# Patient Record
Sex: Male | Born: 1965 | Race: Black or African American | Hispanic: No | Marital: Married | Smoking: Former smoker
Health system: Southern US, Community
[De-identification: ages and names within clinical notes are randomized; demographics above are authoritative.]

## PROBLEM LIST (undated history)

## (undated) ENCOUNTER — Ambulatory Visit (HOSPITAL_COMMUNITY): Discharge status: Absent without leave | Payer: Self-pay

## (undated) DIAGNOSIS — J449 Chronic obstructive pulmonary disease, unspecified: Secondary | ICD-10-CM

## (undated) DIAGNOSIS — W3400XA Accidental discharge from unspecified firearms or gun, initial encounter: Secondary | ICD-10-CM

## (undated) DIAGNOSIS — I10 Essential (primary) hypertension: Secondary | ICD-10-CM

## (undated) DIAGNOSIS — J40 Bronchitis, not specified as acute or chronic: Secondary | ICD-10-CM

## (undated) DIAGNOSIS — J45909 Unspecified asthma, uncomplicated: Secondary | ICD-10-CM

## (undated) DIAGNOSIS — T7840XA Allergy, unspecified, initial encounter: Secondary | ICD-10-CM

## (undated) DIAGNOSIS — K219 Gastro-esophageal reflux disease without esophagitis: Secondary | ICD-10-CM

## (undated) DIAGNOSIS — Z5189 Encounter for other specified aftercare: Secondary | ICD-10-CM

## (undated) DIAGNOSIS — Y249XXA Unspecified firearm discharge, undetermined intent, initial encounter: Secondary | ICD-10-CM

## (undated) HISTORY — DX: Encounter for other specified aftercare: Z51.89

## (undated) HISTORY — PX: STOMACH SURGERY: SHX791

## (undated) HISTORY — DX: Allergy, unspecified, initial encounter: T78.40XA

## (undated) HISTORY — PX: OTHER SURGICAL HISTORY: SHX169

## (undated) HISTORY — PX: HIP SURGERY: SHX245

## (undated) HISTORY — DX: Gastro-esophageal reflux disease without esophagitis: K21.9

## (undated) HISTORY — PX: ABDOMINAL SURGERY: SHX537

---

## 2000-04-26 ENCOUNTER — Emergency Department (HOSPITAL_COMMUNITY): Admission: EM | Admit: 2000-04-26 | Discharge: 2000-04-26 | Payer: Self-pay | Admitting: Emergency Medicine

## 2000-07-29 ENCOUNTER — Emergency Department (HOSPITAL_COMMUNITY): Admission: EM | Admit: 2000-07-29 | Discharge: 2000-07-29 | Payer: Self-pay | Admitting: Emergency Medicine

## 2000-07-29 ENCOUNTER — Encounter: Payer: Self-pay | Admitting: Emergency Medicine

## 2002-01-31 ENCOUNTER — Emergency Department (HOSPITAL_COMMUNITY): Admission: EM | Admit: 2002-01-31 | Discharge: 2002-01-31 | Payer: Self-pay | Admitting: Emergency Medicine

## 2002-01-31 ENCOUNTER — Encounter: Payer: Self-pay | Admitting: Emergency Medicine

## 2002-02-05 ENCOUNTER — Emergency Department (HOSPITAL_COMMUNITY): Admission: EM | Admit: 2002-02-05 | Discharge: 2002-02-05 | Payer: Self-pay | Admitting: Physical Therapy

## 2002-05-28 ENCOUNTER — Emergency Department (HOSPITAL_COMMUNITY): Admission: EM | Admit: 2002-05-28 | Discharge: 2002-05-28 | Payer: Self-pay | Admitting: Emergency Medicine

## 2002-09-28 ENCOUNTER — Emergency Department (HOSPITAL_COMMUNITY): Admission: EM | Admit: 2002-09-28 | Discharge: 2002-09-28 | Payer: Self-pay | Admitting: Emergency Medicine

## 2002-09-28 ENCOUNTER — Encounter: Payer: Self-pay | Admitting: Emergency Medicine

## 2005-11-05 ENCOUNTER — Emergency Department (HOSPITAL_COMMUNITY): Admission: EM | Admit: 2005-11-05 | Discharge: 2005-11-05 | Payer: Self-pay | Admitting: Emergency Medicine

## 2007-10-15 ENCOUNTER — Emergency Department (HOSPITAL_COMMUNITY): Admission: EM | Admit: 2007-10-15 | Discharge: 2007-10-16 | Payer: Self-pay | Admitting: Emergency Medicine

## 2007-10-15 ENCOUNTER — Emergency Department (HOSPITAL_COMMUNITY): Admission: EM | Admit: 2007-10-15 | Discharge: 2007-10-15 | Payer: Self-pay | Admitting: Emergency Medicine

## 2008-07-31 ENCOUNTER — Emergency Department (HOSPITAL_COMMUNITY): Admission: EM | Admit: 2008-07-31 | Discharge: 2008-07-31 | Payer: Self-pay | Admitting: Emergency Medicine

## 2009-02-17 ENCOUNTER — Emergency Department (HOSPITAL_COMMUNITY): Admission: EM | Admit: 2009-02-17 | Discharge: 2009-02-17 | Payer: Self-pay | Admitting: Emergency Medicine

## 2009-09-24 ENCOUNTER — Emergency Department (HOSPITAL_COMMUNITY): Admission: EM | Admit: 2009-09-24 | Discharge: 2009-09-24 | Payer: Self-pay | Admitting: Family Medicine

## 2010-03-30 ENCOUNTER — Emergency Department (HOSPITAL_COMMUNITY): Payer: Self-pay

## 2010-03-30 ENCOUNTER — Emergency Department (HOSPITAL_COMMUNITY)
Admission: EM | Admit: 2010-03-30 | Discharge: 2010-03-30 | Disposition: A | Discharge status: Home / Self Care | Payer: Self-pay | Attending: Emergency Medicine | Admitting: Emergency Medicine

## 2010-03-30 DIAGNOSIS — R059 Cough, unspecified: Secondary | ICD-10-CM | POA: Insufficient documentation

## 2010-03-30 DIAGNOSIS — R070 Pain in throat: Secondary | ICD-10-CM | POA: Insufficient documentation

## 2010-03-30 DIAGNOSIS — R05 Cough: Secondary | ICD-10-CM | POA: Insufficient documentation

## 2010-03-30 DIAGNOSIS — R0602 Shortness of breath: Secondary | ICD-10-CM | POA: Insufficient documentation

## 2010-03-30 DIAGNOSIS — D1739 Benign lipomatous neoplasm of skin and subcutaneous tissue of other sites: Secondary | ICD-10-CM | POA: Insufficient documentation

## 2010-03-30 DIAGNOSIS — G8929 Other chronic pain: Secondary | ICD-10-CM | POA: Insufficient documentation

## 2010-03-30 DIAGNOSIS — J40 Bronchitis, not specified as acute or chronic: Secondary | ICD-10-CM | POA: Insufficient documentation

## 2010-03-30 DIAGNOSIS — R599 Enlarged lymph nodes, unspecified: Secondary | ICD-10-CM | POA: Insufficient documentation

## 2010-03-30 DIAGNOSIS — R062 Wheezing: Secondary | ICD-10-CM | POA: Insufficient documentation

## 2010-03-30 DIAGNOSIS — M549 Dorsalgia, unspecified: Secondary | ICD-10-CM | POA: Insufficient documentation

## 2010-10-13 LAB — POCT CARDIAC MARKERS: CKMB, poc: 1.7

## 2011-05-04 ENCOUNTER — Emergency Department (HOSPITAL_COMMUNITY)
Admission: EM | Admit: 2011-05-04 | Discharge: 2011-05-05 | Disposition: A | Discharge status: Home / Self Care | Payer: Self-pay | Attending: Emergency Medicine | Admitting: Emergency Medicine

## 2011-05-04 DIAGNOSIS — K0889 Other specified disorders of teeth and supporting structures: Secondary | ICD-10-CM

## 2011-05-04 DIAGNOSIS — R05 Cough: Secondary | ICD-10-CM | POA: Insufficient documentation

## 2011-05-04 DIAGNOSIS — J40 Bronchitis, not specified as acute or chronic: Secondary | ICD-10-CM | POA: Insufficient documentation

## 2011-05-04 DIAGNOSIS — R059 Cough, unspecified: Secondary | ICD-10-CM | POA: Insufficient documentation

## 2011-05-04 DIAGNOSIS — R0602 Shortness of breath: Secondary | ICD-10-CM | POA: Insufficient documentation

## 2011-05-04 DIAGNOSIS — R062 Wheezing: Secondary | ICD-10-CM | POA: Insufficient documentation

## 2011-05-04 DIAGNOSIS — K089 Disorder of teeth and supporting structures, unspecified: Secondary | ICD-10-CM | POA: Insufficient documentation

## 2011-05-05 ENCOUNTER — Emergency Department (HOSPITAL_COMMUNITY): Payer: Self-pay

## 2011-05-05 ENCOUNTER — Encounter (HOSPITAL_COMMUNITY): Payer: Self-pay | Admitting: *Deleted

## 2011-05-05 LAB — POCT I-STAT, CHEM 8
BUN: 16 mg/dL (ref 6–23)
Calcium, Ion: 1.17 mmol/L (ref 1.12–1.32)
Chloride: 108 mEq/L (ref 96–112)
HCT: 42 % (ref 39.0–52.0)
Sodium: 140 mEq/L (ref 135–145)
TCO2: 24 mmol/L (ref 0–100)

## 2011-05-05 LAB — CBC
MCH: 30.7 pg (ref 26.0–34.0)
MCHC: 34.8 g/dL (ref 30.0–36.0)
MCV: 88.3 fL (ref 78.0–100.0)
RDW: 12.7 % (ref 11.5–15.5)

## 2011-05-05 MED ORDER — ALBUTEROL SULFATE (5 MG/ML) 0.5% IN NEBU
5.0000 mg | INHALATION_SOLUTION | Freq: Once | RESPIRATORY_TRACT | Status: AC
  Administered 2011-05-05: 5 mg via RESPIRATORY_TRACT
  Filled 2011-05-05: qty 1

## 2011-05-05 MED ORDER — AZITHROMYCIN 250 MG PO TABS: 250.0000 mg | ORAL_TABLET | Freq: Every day | ORAL | Status: AC

## 2011-05-05 MED ORDER — BENZONATATE 100 MG PO CAPS: 200.0000 mg | ORAL_CAPSULE | Freq: Two times a day (BID) | ORAL | Status: AC | PRN

## 2011-05-05 MED ORDER — AZITHROMYCIN 250 MG PO TABS: 250.0000 mg | ORAL_TABLET | Freq: Every day | ORAL | Status: DC

## 2011-05-05 MED ORDER — ALBUTEROL SULFATE HFA 108 (90 BASE) MCG/ACT IN AERS
2.0000 | INHALATION_SPRAY | Freq: Once | RESPIRATORY_TRACT | Status: AC
  Administered 2011-05-05: 2 via RESPIRATORY_TRACT
  Filled 2011-05-05: qty 6.7

## 2011-05-05 MED ORDER — KETOROLAC TROMETHAMINE 60 MG/2ML IM SOLN
60.0000 mg | Freq: Once | INTRAMUSCULAR | Status: AC
  Administered 2011-05-05: 60 mg via INTRAMUSCULAR
  Filled 2011-05-05: qty 2

## 2011-05-05 MED ORDER — BENZONATATE 100 MG PO CAPS: 200.0000 mg | ORAL_CAPSULE | Freq: Two times a day (BID) | ORAL | Status: DC | PRN
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
## 2011-05-05 NOTE — Discharge Instructions (Signed)
Please call the phone numbers listed below if you do not have a dentist or doctor. Take the Zithromax every day to help with your dental pain and a cough, use the albuterol inhaler 2 puffs every 4 hours for difficulty breathing or wheezing, take the Tessalon for cough.  Your chest x-ray was normal, your blood work was normal.

## 2011-05-05 NOTE — ED Notes (Signed)
Patient completed breathing treatment.  Rhonchi decreased and no exp wheezes

## 2011-05-05 NOTE — ED Notes (Signed)
Pt is here for evaluation of sob that began today at work (but he reports some sob "for a while").  Pt states that he has had a cough which is productive for brown sputum.  Pt states that he has increasing sob with activity.  Pt appears sob in triage, speaking in full sentences.  Pt also reports dental pain and dental abscess (recently pulled his own tooth and it broke).  No fever or chills with this.

## 2011-09-15 ENCOUNTER — Emergency Department (HOSPITAL_COMMUNITY): Payer: Self-pay

## 2011-09-15 ENCOUNTER — Encounter (HOSPITAL_COMMUNITY): Payer: Self-pay | Admitting: Emergency Medicine

## 2011-09-15 ENCOUNTER — Emergency Department (HOSPITAL_COMMUNITY)
Admission: EM | Admit: 2011-09-15 | Discharge: 2011-09-15 | Disposition: A | Discharge status: Home / Self Care | Payer: Self-pay | Attending: Emergency Medicine | Admitting: Emergency Medicine

## 2011-09-15 DIAGNOSIS — R0602 Shortness of breath: Secondary | ICD-10-CM | POA: Insufficient documentation

## 2011-09-15 DIAGNOSIS — J209 Acute bronchitis, unspecified: Secondary | ICD-10-CM

## 2011-09-15 DIAGNOSIS — F17200 Nicotine dependence, unspecified, uncomplicated: Secondary | ICD-10-CM | POA: Insufficient documentation

## 2011-09-15 HISTORY — DX: Bronchitis, not specified as acute or chronic: J40

## 2011-09-15 MED ORDER — ALBUTEROL SULFATE (5 MG/ML) 0.5% IN NEBU
5.0000 mg | INHALATION_SOLUTION | Freq: Once | RESPIRATORY_TRACT | Status: AC
  Administered 2011-09-15: 5 mg via RESPIRATORY_TRACT
  Filled 2011-09-15: qty 1

## 2011-09-15 MED ORDER — AZITHROMYCIN 250 MG PO TABS
500.0000 mg | ORAL_TABLET | Freq: Once | ORAL | Status: AC
  Administered 2011-09-15: 500 mg via ORAL
  Filled 2011-09-15: qty 2

## 2011-09-15 MED ORDER — HYDROCOD POLST-CHLORPHEN POLST 10-8 MG/5ML PO LQCR: 5.0000 mL | Freq: Two times a day (BID) | ORAL | Status: DC | PRN

## 2011-09-15 MED ORDER — BENZONATATE 100 MG PO CAPS: 100.0000 mg | ORAL_CAPSULE | Freq: Three times a day (TID) | ORAL | Status: AC

## 2011-09-15 MED ORDER — AZITHROMYCIN 250 MG PO TABS: 250.0000 mg | ORAL_TABLET | Freq: Every day | ORAL | Status: AC

## 2011-09-15 NOTE — ED Notes (Addendum)
Patient has been having a cough for the last several days.  Has received the Tessalon Pearls for bronchitis and they seemed to have worked.

## 2011-09-15 NOTE — ED Notes (Signed)
PT. REPORTS PERSISTENT SOB WITH DRY COUGH FOR SEVERAL DAYS , STATES HISTORY OF BRONCHITIS , DENIES FEVER OR CHILLS.

## 2011-09-15 NOTE — ED Notes (Signed)
Breathing treatment completed.  Still has few wheezes in the bases
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
## 2012-05-12 ENCOUNTER — Emergency Department (HOSPITAL_COMMUNITY)
Admission: EM | Admit: 2012-05-12 | Discharge: 2012-05-12 | Disposition: A | Discharge status: Home / Self Care | Payer: Self-pay | Attending: Emergency Medicine | Admitting: Emergency Medicine

## 2012-05-12 ENCOUNTER — Emergency Department (HOSPITAL_COMMUNITY): Payer: Self-pay

## 2012-05-12 ENCOUNTER — Encounter (HOSPITAL_COMMUNITY): Payer: Self-pay | Admitting: Emergency Medicine

## 2012-05-12 DIAGNOSIS — R05 Cough: Secondary | ICD-10-CM | POA: Insufficient documentation

## 2012-05-12 DIAGNOSIS — R059 Cough, unspecified: Secondary | ICD-10-CM | POA: Insufficient documentation

## 2012-05-12 DIAGNOSIS — J9801 Acute bronchospasm: Secondary | ICD-10-CM | POA: Insufficient documentation

## 2012-05-12 DIAGNOSIS — Z79899 Other long term (current) drug therapy: Secondary | ICD-10-CM | POA: Insufficient documentation

## 2012-05-12 DIAGNOSIS — Z8709 Personal history of other diseases of the respiratory system: Secondary | ICD-10-CM | POA: Insufficient documentation

## 2012-05-12 DIAGNOSIS — F17200 Nicotine dependence, unspecified, uncomplicated: Secondary | ICD-10-CM | POA: Insufficient documentation

## 2012-05-12 MED ORDER — PREDNISONE 20 MG PO TABS
60.0000 mg | ORAL_TABLET | Freq: Once | ORAL | Status: AC
  Administered 2012-05-12: 60 mg via ORAL
  Filled 2012-05-12: qty 3

## 2012-05-12 MED ORDER — IPRATROPIUM BROMIDE 0.02 % IN SOLN
0.5000 mg | Freq: Once | RESPIRATORY_TRACT | Status: AC
  Administered 2012-05-12: 0.5 mg via RESPIRATORY_TRACT
  Filled 2012-05-12 (×2): qty 2.5

## 2012-05-12 MED ORDER — IPRATROPIUM BROMIDE 0.02 % IN SOLN
RESPIRATORY_TRACT | Status: AC
  Filled 2012-05-12: qty 2.5

## 2012-05-12 MED ORDER — IPRATROPIUM BROMIDE 0.02 % IN SOLN
0.5000 mg | Freq: Once | RESPIRATORY_TRACT | Status: AC
  Administered 2012-05-12: 0.5 mg via RESPIRATORY_TRACT

## 2012-05-12 MED ORDER — ALBUTEROL SULFATE (5 MG/ML) 0.5% IN NEBU
5.0000 mg | INHALATION_SOLUTION | Freq: Once | RESPIRATORY_TRACT | Status: AC
  Administered 2012-05-12: 5 mg via RESPIRATORY_TRACT
  Filled 2012-05-12 (×2): qty 1

## 2012-05-12 MED ORDER — ALBUTEROL SULFATE (5 MG/ML) 0.5% IN NEBU: 2.5000 mg | INHALATION_SOLUTION | RESPIRATORY_TRACT | Status: DC | PRN

## 2012-05-12 MED ORDER — ALBUTEROL SULFATE HFA 108 (90 BASE) MCG/ACT IN AERS: 2.0000 | INHALATION_SPRAY | RESPIRATORY_TRACT | Status: DC | PRN

## 2012-05-12 MED ORDER — BENZONATATE 100 MG PO CAPS: 200.0000 mg | ORAL_CAPSULE | Freq: Two times a day (BID) | ORAL | Status: DC | PRN

## 2012-05-12 MED ORDER — ALBUTEROL SULFATE (5 MG/ML) 0.5% IN NEBU
5.0000 mg | INHALATION_SOLUTION | Freq: Once | RESPIRATORY_TRACT | Status: AC
  Administered 2012-05-12: 5 mg via RESPIRATORY_TRACT

## 2012-05-12 MED ORDER — ALBUTEROL SULFATE (5 MG/ML) 0.5% IN NEBU
INHALATION_SOLUTION | RESPIRATORY_TRACT | Status: AC
  Filled 2012-05-12: qty 1

## 2012-05-12 NOTE — ED Notes (Signed)
Pt sts hx of bronchitis and out of home meds; pt sts SOB x 3 days

## 2012-05-12 NOTE — ED Notes (Signed)
Pulse oximetry remained above 94% when ambulating. NAD when ambulating
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
## 2012-05-12 NOTE — ED Provider Notes (Signed)
Medical screening examination/treatment/procedure(s) were performed by non-physician practitioner and as supervising physician I was immediately available for consultation/collaboration.  Flint Melter, MD 05/12/12 [phone]

## 2012-05-12 NOTE — ED Notes (Signed)
RT in to give pt breathing tx.  Already started by RN.

## 2012-05-18 ENCOUNTER — Emergency Department (HOSPITAL_BASED_OUTPATIENT_CLINIC_OR_DEPARTMENT_OTHER)
Admission: EM | Admit: 2012-05-18 | Discharge: 2012-05-18 | Disposition: A | Discharge status: Home / Self Care | Payer: Self-pay | Attending: Emergency Medicine | Admitting: Emergency Medicine

## 2012-05-18 ENCOUNTER — Encounter (HOSPITAL_BASED_OUTPATIENT_CLINIC_OR_DEPARTMENT_OTHER): Payer: Self-pay | Admitting: *Deleted

## 2012-05-18 DIAGNOSIS — R059 Cough, unspecified: Secondary | ICD-10-CM | POA: Insufficient documentation

## 2012-05-18 DIAGNOSIS — Z87891 Personal history of nicotine dependence: Secondary | ICD-10-CM | POA: Insufficient documentation

## 2012-05-18 DIAGNOSIS — Z79899 Other long term (current) drug therapy: Secondary | ICD-10-CM | POA: Insufficient documentation

## 2012-05-18 DIAGNOSIS — R05 Cough: Secondary | ICD-10-CM | POA: Insufficient documentation

## 2012-05-18 DIAGNOSIS — J45901 Unspecified asthma with (acute) exacerbation: Secondary | ICD-10-CM | POA: Insufficient documentation

## 2012-05-18 MED ORDER — IPRATROPIUM BROMIDE 0.02 % IN SOLN
0.5000 mg | Freq: Once | RESPIRATORY_TRACT | Status: AC
  Administered 2012-05-18: 0.5 mg via RESPIRATORY_TRACT

## 2012-05-18 MED ORDER — ALBUTEROL SULFATE HFA 108 (90 BASE) MCG/ACT IN AERS
INHALATION_SPRAY | RESPIRATORY_TRACT | Status: AC
  Filled 2012-05-18: qty 6.7

## 2012-05-18 MED ORDER — ALBUTEROL SULFATE (5 MG/ML) 0.5% IN NEBU
5.0000 mg | INHALATION_SOLUTION | Freq: Once | RESPIRATORY_TRACT | Status: AC
  Administered 2012-05-18: 5 mg via RESPIRATORY_TRACT

## 2012-05-18 MED ORDER — ALBUTEROL SULFATE HFA 108 (90 BASE) MCG/ACT IN AERS
1.0000 | INHALATION_SPRAY | RESPIRATORY_TRACT | Status: DC | PRN
  Administered 2012-05-18: 2 via RESPIRATORY_TRACT

## 2012-05-18 MED ORDER — ALBUTEROL SULFATE (2.5 MG/3ML) 0.083% IN NEBU: 2.5000 mg | INHALATION_SOLUTION | RESPIRATORY_TRACT | Status: DC | PRN

## 2012-05-18 MED ORDER — ALBUTEROL SULFATE (5 MG/ML) 0.5% IN NEBU
INHALATION_SOLUTION | RESPIRATORY_TRACT | Status: AC
  Filled 2012-05-18: qty 1

## 2012-05-18 MED ORDER — PREDNISONE 10 MG PO TABS: 40.0000 mg | ORAL_TABLET | Freq: Every day | ORAL | Status: DC

## 2012-05-18 MED ORDER — IPRATROPIUM BROMIDE 0.02 % IN SOLN
RESPIRATORY_TRACT | Status: AC
  Filled 2012-05-18: qty 2.5

## 2012-05-18 MED ORDER — PREDNISONE 50 MG PO TABS
60.0000 mg | ORAL_TABLET | Freq: Once | ORAL | Status: AC
  Administered 2012-05-18: 60 mg via ORAL
  Filled 2012-05-18: qty 1
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
## 2012-05-18 NOTE — ED Notes (Signed)
Pt seen at [facility] for bronchitis 2 weeks ago- has been using albuterol without relief- states "i can't breathe that good"

## 2012-05-25 NOTE — ED Provider Notes (Signed)
Medical screening examination/treatment/procedure(s) were performed by non-physician practitioner and as supervising physician I was immediately available for consultation/collaboration.  Flint Melter, MD 05/25/12 1235

## 2012-05-31 ENCOUNTER — Encounter (HOSPITAL_BASED_OUTPATIENT_CLINIC_OR_DEPARTMENT_OTHER): Payer: Self-pay

## 2012-05-31 ENCOUNTER — Emergency Department (HOSPITAL_BASED_OUTPATIENT_CLINIC_OR_DEPARTMENT_OTHER)
Admission: EM | Admit: 2012-05-31 | Discharge: 2012-05-31 | Disposition: A | Discharge status: Home / Self Care | Payer: Self-pay | Attending: Emergency Medicine | Admitting: Emergency Medicine

## 2012-05-31 ENCOUNTER — Emergency Department (HOSPITAL_BASED_OUTPATIENT_CLINIC_OR_DEPARTMENT_OTHER): Payer: Self-pay

## 2012-05-31 DIAGNOSIS — R05 Cough: Secondary | ICD-10-CM | POA: Insufficient documentation

## 2012-05-31 DIAGNOSIS — J45909 Unspecified asthma, uncomplicated: Secondary | ICD-10-CM | POA: Insufficient documentation

## 2012-05-31 DIAGNOSIS — R6883 Chills (without fever): Secondary | ICD-10-CM | POA: Insufficient documentation

## 2012-05-31 DIAGNOSIS — R0609 Other forms of dyspnea: Secondary | ICD-10-CM | POA: Insufficient documentation

## 2012-05-31 DIAGNOSIS — R0989 Other specified symptoms and signs involving the circulatory and respiratory systems: Secondary | ICD-10-CM | POA: Insufficient documentation

## 2012-05-31 DIAGNOSIS — Z79899 Other long term (current) drug therapy: Secondary | ICD-10-CM | POA: Insufficient documentation

## 2012-05-31 DIAGNOSIS — R059 Cough, unspecified: Secondary | ICD-10-CM | POA: Insufficient documentation

## 2012-05-31 DIAGNOSIS — R0600 Dyspnea, unspecified: Secondary | ICD-10-CM

## 2012-05-31 DIAGNOSIS — Z87891 Personal history of nicotine dependence: Secondary | ICD-10-CM | POA: Insufficient documentation

## 2012-05-31 HISTORY — DX: Unspecified asthma, uncomplicated: J45.909

## 2012-05-31 MED ORDER — IPRATROPIUM BROMIDE 0.02 % IN SOLN
0.5000 mg | RESPIRATORY_TRACT | Status: DC
  Administered 2012-05-31: 0.5 mg via RESPIRATORY_TRACT
  Filled 2012-05-31: qty 2.5

## 2012-05-31 MED ORDER — ALBUTEROL SULFATE HFA 108 (90 BASE) MCG/ACT IN AERS
1.0000 | INHALATION_SPRAY | RESPIRATORY_TRACT | Status: DC | PRN
  Administered 2012-05-31: 2 via RESPIRATORY_TRACT
  Filled 2012-05-31: qty 6.7

## 2012-05-31 MED ORDER — ALBUTEROL SULFATE (5 MG/ML) 0.5% IN NEBU
2.5000 mg | INHALATION_SOLUTION | RESPIRATORY_TRACT | Status: DC
  Administered 2012-05-31: 2.5 mg via RESPIRATORY_TRACT
  Filled 2012-05-31: qty 0.5

## 2012-05-31 MED ORDER — PREDNISONE 50 MG PO TABS: 60.0000 mg | ORAL_TABLET | Freq: Every day | ORAL | Status: DC

## 2012-05-31 MED ORDER — PREDNISONE 50 MG PO TABS
ORAL_TABLET | ORAL | Status: AC
  Administered 2012-05-31: 50 mg
  Filled 2012-05-31: qty 1

## 2012-05-31 MED ORDER — ALBUTEROL SULFATE (5 MG/ML) 0.5% IN NEBU
5.0000 mg | INHALATION_SOLUTION | Freq: Once | RESPIRATORY_TRACT | Status: AC
  Administered 2012-05-31: 5 mg via RESPIRATORY_TRACT
  Filled 2012-05-31: qty 1

## 2012-05-31 MED ORDER — PREDNISONE 50 MG PO TABS: 60.0000 mg | ORAL_TABLET | ORAL | Status: AC

## 2012-05-31 MED ORDER — PREDNISONE 20 MG PO TABS: 60.0000 mg | ORAL_TABLET | Freq: Every day | ORAL | Status: AC

## 2012-05-31 MED ORDER — PREDNISONE 10 MG PO TABS: 10.0000 mg | ORAL_TABLET | Freq: Every day | ORAL | Status: DC

## 2012-05-31 MED ORDER — PREDNISONE 10 MG PO TABS
ORAL_TABLET | ORAL | Status: AC
  Administered 2012-05-31: 10 mg
  Filled 2012-05-31: qty 1
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
## 2012-05-31 NOTE — ED Notes (Signed)
Pt was seen here 8 days ago for bronchitis, states symptoms came back 4 days later and has been having trouble breathing and sleeping.pt has been taking breathing txts q6h "when im not at work, [employer]." No N/V/D/Fever. Pt denies chest pain. Hx of asthma.

## 2012-08-24 ENCOUNTER — Emergency Department (INDEPENDENT_AMBULATORY_CARE_PROVIDER_SITE_OTHER)
Admission: EM | Admit: 2012-08-24 | Discharge: 2012-08-24 | Disposition: A | Discharge status: Home / Self Care | Payer: Self-pay | Source: Home / Self Care

## 2012-08-24 ENCOUNTER — Encounter (HOSPITAL_COMMUNITY): Payer: Self-pay | Admitting: *Deleted

## 2012-08-24 DIAGNOSIS — Z87891 Personal history of nicotine dependence: Secondary | ICD-10-CM

## 2012-08-24 DIAGNOSIS — J9801 Acute bronchospasm: Secondary | ICD-10-CM

## 2012-08-24 MED ORDER — TRIAMCINOLONE ACETONIDE 40 MG/ML IJ SUSP
INTRAMUSCULAR | Status: AC
  Filled 2012-08-24: qty 1

## 2012-08-24 MED ORDER — IPRATROPIUM BROMIDE 0.02 % IN SOLN
0.5000 mg | Freq: Once | RESPIRATORY_TRACT | Status: AC
  Administered 2012-08-24: 0.5 mg via RESPIRATORY_TRACT

## 2012-08-24 MED ORDER — ALBUTEROL SULFATE (5 MG/ML) 0.5% IN NEBU
INHALATION_SOLUTION | RESPIRATORY_TRACT | Status: AC
  Filled 2012-08-24: qty 1

## 2012-08-24 MED ORDER — ALBUTEROL SULFATE (5 MG/ML) 0.5% IN NEBU
5.0000 mg | INHALATION_SOLUTION | Freq: Once | RESPIRATORY_TRACT | Status: AC
  Administered 2012-08-24: 5 mg via RESPIRATORY_TRACT

## 2012-08-24 MED ORDER — ALBUTEROL SULFATE HFA 108 (90 BASE) MCG/ACT IN AERS: 1.0000 | INHALATION_SPRAY | Freq: Four times a day (QID) | RESPIRATORY_TRACT | Status: DC | PRN

## 2012-08-24 MED ORDER — METHYLPREDNISOLONE 4 MG PO KIT: PACK | ORAL | Status: DC

## 2012-08-24 MED ORDER — TRIAMCINOLONE ACETONIDE 40 MG/ML IJ SUSP
40.0000 mg | Freq: Once | INTRAMUSCULAR | Status: AC
  Administered 2012-08-24: 40 mg via INTRAMUSCULAR

## 2012-08-24 MED ORDER — ALBUTEROL SULFATE (5 MG/ML) 0.5% IN NEBU: 2.5000 mg | INHALATION_SOLUTION | Freq: Four times a day (QID) | RESPIRATORY_TRACT | Status: DC | PRN
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
## 2012-08-24 NOTE — ED Notes (Signed)
States his bronchitis flared up on Friday and ran out of Albuteral inhaler and nebulizer treatment.  C/o wheezing worse at night.  He could not sleep last night.  Coughing last night a lot but only occasional during the day. Cough prod. Of white foam. Coughed so hard he threw up.  No chills or fever.  No runny nose or sore throat.

## 2012-08-24 NOTE — ED Provider Notes (Signed)
Medical screening examination/treatment/procedure(s) were performed by non-physician practitioner and as supervising physician I was immediately available for consultation/collaboration.  Leslee Home, M.D.  Reuben Likes, MD 08/24/12 1430

## 2012-10-31 ENCOUNTER — Encounter (HOSPITAL_COMMUNITY): Payer: Self-pay | Admitting: Emergency Medicine

## 2012-10-31 ENCOUNTER — Emergency Department (HOSPITAL_COMMUNITY)
Admission: EM | Admit: 2012-10-31 | Discharge: 2012-10-31 | Disposition: A | Discharge status: Home / Self Care | Payer: Self-pay | Attending: Emergency Medicine | Admitting: Emergency Medicine

## 2012-10-31 ENCOUNTER — Emergency Department (HOSPITAL_COMMUNITY): Payer: Self-pay

## 2012-10-31 DIAGNOSIS — F17200 Nicotine dependence, unspecified, uncomplicated: Secondary | ICD-10-CM | POA: Insufficient documentation

## 2012-10-31 DIAGNOSIS — J209 Acute bronchitis, unspecified: Secondary | ICD-10-CM

## 2012-10-31 DIAGNOSIS — R062 Wheezing: Secondary | ICD-10-CM

## 2012-10-31 DIAGNOSIS — J441 Chronic obstructive pulmonary disease with (acute) exacerbation: Secondary | ICD-10-CM | POA: Insufficient documentation

## 2012-10-31 DIAGNOSIS — R0789 Other chest pain: Secondary | ICD-10-CM | POA: Insufficient documentation

## 2012-10-31 MED ORDER — AZITHROMYCIN 250 MG PO TABS: 250.0000 mg | ORAL_TABLET | Freq: Every day | ORAL | Status: DC

## 2012-10-31 MED ORDER — ALBUTEROL SULFATE HFA 108 (90 BASE) MCG/ACT IN AERS
2.0000 | INHALATION_SPRAY | RESPIRATORY_TRACT | Status: DC | PRN
  Administered 2012-10-31: 2 via RESPIRATORY_TRACT
  Filled 2012-10-31: qty 6.7

## 2012-10-31 MED ORDER — IPRATROPIUM BROMIDE 0.02 % IN SOLN
0.5000 mg | Freq: Once | RESPIRATORY_TRACT | Status: AC
  Administered 2012-10-31: 0.5 mg via RESPIRATORY_TRACT
  Filled 2012-10-31: qty 2.5

## 2012-10-31 MED ORDER — ALBUTEROL SULFATE (5 MG/ML) 0.5% IN NEBU
5.0000 mg | INHALATION_SOLUTION | Freq: Once | RESPIRATORY_TRACT | Status: AC
  Administered 2012-10-31: 5 mg via RESPIRATORY_TRACT
  Filled 2012-10-31: qty 1

## 2012-10-31 MED ORDER — PREDNISONE 20 MG PO TABS
60.0000 mg | ORAL_TABLET | Freq: Once | ORAL | Status: AC
  Administered 2012-10-31: 60 mg via ORAL
  Filled 2012-10-31: qty 3

## 2012-10-31 MED ORDER — ALBUTEROL SULFATE (2.5 MG/3ML) 0.083% IN NEBU
2.5000 mg | INHALATION_SOLUTION | RESPIRATORY_TRACT | Status: DC | PRN
Start: 2012-10-31 — End: 2013-06-28

## 2012-10-31 MED ORDER — PREDNISONE 20 MG PO TABS: 40.0000 mg | ORAL_TABLET | Freq: Every day | ORAL | Status: DC

## 2012-10-31 NOTE — ED Notes (Signed)
Pt reports some SOB over the past couple of days. Reports a hx of bronchitis, states that he uses a neb. At home and it has not helped much. States that he used his last treatment this morning. States he feels like he needs some prednisone.
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
## 2012-10-31 NOTE — ED Notes (Signed)
PA at bedside.

## 2012-11-01 NOTE — ED Provider Notes (Signed)
Medical screening examination/treatment/procedure(s) were performed by non-physician practitioner and as supervising physician I was immediately available for consultation/collaboration.   Shiza Thelen, MD 11/01/12 1515 

## 2013-01-08 ENCOUNTER — Emergency Department (HOSPITAL_BASED_OUTPATIENT_CLINIC_OR_DEPARTMENT_OTHER): Payer: Self-pay

## 2013-01-08 ENCOUNTER — Encounter (HOSPITAL_BASED_OUTPATIENT_CLINIC_OR_DEPARTMENT_OTHER): Payer: Self-pay | Admitting: Emergency Medicine

## 2013-01-08 ENCOUNTER — Emergency Department (HOSPITAL_BASED_OUTPATIENT_CLINIC_OR_DEPARTMENT_OTHER)
Admission: EM | Admit: 2013-01-08 | Discharge: 2013-01-08 | Disposition: A | Discharge status: Home / Self Care | Payer: Self-pay | Attending: Emergency Medicine | Admitting: Emergency Medicine

## 2013-01-08 DIAGNOSIS — J45901 Unspecified asthma with (acute) exacerbation: Secondary | ICD-10-CM | POA: Insufficient documentation

## 2013-01-08 DIAGNOSIS — Z792 Long term (current) use of antibiotics: Secondary | ICD-10-CM | POA: Insufficient documentation

## 2013-01-08 DIAGNOSIS — F17200 Nicotine dependence, unspecified, uncomplicated: Secondary | ICD-10-CM | POA: Insufficient documentation

## 2013-01-08 MED ORDER — IPRATROPIUM BROMIDE 0.02 % IN SOLN
RESPIRATORY_TRACT | Status: AC
  Filled 2013-01-08: qty 2.5

## 2013-01-08 MED ORDER — IPRATROPIUM BROMIDE 0.02 % IN SOLN
0.5000 mg | Freq: Once | RESPIRATORY_TRACT | Status: AC
  Administered 2013-01-08: 0.5 mg via RESPIRATORY_TRACT
  Filled 2013-01-08: qty 2.5

## 2013-01-08 MED ORDER — ALBUTEROL SULFATE (5 MG/ML) 0.5% IN NEBU
5.0000 mg | INHALATION_SOLUTION | Freq: Once | RESPIRATORY_TRACT | Status: AC
  Administered 2013-01-08: 5 mg via RESPIRATORY_TRACT

## 2013-01-08 MED ORDER — IPRATROPIUM BROMIDE 0.02 % IN SOLN
0.5000 mg | Freq: Once | RESPIRATORY_TRACT | Status: AC
  Administered 2013-01-08: 0.5 mg via RESPIRATORY_TRACT

## 2013-01-08 MED ORDER — ALBUTEROL SULFATE HFA 108 (90 BASE) MCG/ACT IN AERS: 1.0000 | INHALATION_SPRAY | Freq: Four times a day (QID) | RESPIRATORY_TRACT | Status: DC | PRN

## 2013-01-08 MED ORDER — PREDNISONE 50 MG PO TABS: ORAL_TABLET | ORAL | Status: DC

## 2013-01-08 MED ORDER — PREDNISONE 50 MG PO TABS
60.0000 mg | ORAL_TABLET | Freq: Once | ORAL | Status: AC
  Administered 2013-01-08: 60 mg via ORAL
  Filled 2013-01-08 (×2): qty 1

## 2013-01-08 NOTE — ED Notes (Signed)
Resting Spo2 95% HR 87.  After walking one block in the ED SPO2 remained 95%, HR 97.  Pt states "I feel much better."  MD aware.
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
## 2013-01-08 NOTE — ED Notes (Signed)
MD at bedside. 

## 2013-01-08 NOTE — ED Notes (Signed)
Pt c/o cough and congestion  X 3 days pro cough

## 2013-02-13 ENCOUNTER — Emergency Department (HOSPITAL_COMMUNITY)
Admission: EM | Admit: 2013-02-13 | Discharge: 2013-02-13 | Disposition: A | Discharge status: Home / Self Care | Payer: Self-pay | Attending: Emergency Medicine | Admitting: Emergency Medicine

## 2013-02-13 ENCOUNTER — Encounter (HOSPITAL_COMMUNITY): Payer: Self-pay | Admitting: Emergency Medicine

## 2013-02-13 DIAGNOSIS — Z79899 Other long term (current) drug therapy: Secondary | ICD-10-CM | POA: Insufficient documentation

## 2013-02-13 DIAGNOSIS — F17200 Nicotine dependence, unspecified, uncomplicated: Secondary | ICD-10-CM | POA: Insufficient documentation

## 2013-02-13 DIAGNOSIS — J441 Chronic obstructive pulmonary disease with (acute) exacerbation: Secondary | ICD-10-CM | POA: Insufficient documentation

## 2013-02-13 DIAGNOSIS — Z792 Long term (current) use of antibiotics: Secondary | ICD-10-CM | POA: Insufficient documentation

## 2013-02-13 DIAGNOSIS — J45901 Unspecified asthma with (acute) exacerbation: Secondary | ICD-10-CM

## 2013-02-13 HISTORY — DX: Chronic obstructive pulmonary disease, unspecified: J44.9

## 2013-02-13 MED ORDER — PREDNISONE 20 MG PO TABS
60.0000 mg | ORAL_TABLET | Freq: Once | ORAL | Status: AC
  Administered 2013-02-13: 60 mg via ORAL
  Filled 2013-02-13: qty 3

## 2013-02-13 MED ORDER — ALBUTEROL SULFATE (2.5 MG/3ML) 0.083% IN NEBU
5.0000 mg | INHALATION_SOLUTION | Freq: Once | RESPIRATORY_TRACT | Status: AC
  Administered 2013-02-13: 5 mg via RESPIRATORY_TRACT
  Filled 2013-02-13: qty 6

## 2013-02-13 MED ORDER — ALBUTEROL SULFATE (2.5 MG/3ML) 0.083% IN NEBU: 2.5000 mg | INHALATION_SOLUTION | Freq: Four times a day (QID) | RESPIRATORY_TRACT | Status: DC | PRN

## 2013-02-13 MED ORDER — PREDNISONE 20 MG PO TABS: 60.0000 mg | ORAL_TABLET | Freq: Every day | ORAL | Status: DC

## 2013-02-13 MED ORDER — ALBUTEROL SULFATE HFA 108 (90 BASE) MCG/ACT IN AERS
2.0000 | INHALATION_SPRAY | RESPIRATORY_TRACT | Status: DC | PRN
  Administered 2013-02-13: 2 via RESPIRATORY_TRACT
  Filled 2013-02-13: qty 6.7

## 2013-02-13 MED ORDER — IPRATROPIUM BROMIDE 0.02 % IN SOLN
0.5000 mg | Freq: Once | RESPIRATORY_TRACT | Status: AC
  Administered 2013-02-13: 0.5 mg via RESPIRATORY_TRACT
  Filled 2013-02-13: qty 2.5

## 2013-02-13 NOTE — Discharge Instructions (Signed)
Asthma, Adult Asthma is a recurring condition in which the airways tighten and narrow. Asthma can make it difficult to breathe. It can cause coughing, wheezing, and shortness of breath. Asthma episodes (also called asthma attacks) range from minor to life-threatening. Asthma cannot be cured, but medicines and lifestyle changes can help control it. CAUSES Asthma is believed to be caused by inherited (genetic) and environmental factors, but its exact cause is unknown. Asthma may be triggered by allergens, lung infections, or irritants in the air. Asthma triggers are different for each person. Common triggers include:   Animal dander.  Dust mites.  Cockroaches.  Pollen from trees or grass.  Mold.  Smoke.  Air pollutants such as dust, household cleaners, hair sprays, aerosol sprays, paint fumes, strong chemicals, or strong odors.  Cold air, weather changes, and winds (which increase molds and pollens in the air).  Strong emotional expressions such as crying or laughing hard.  Stress.  Certain medicines (such as aspirin) or types of drugs (such as beta-blockers).  Sulfites in foods and drinks. Foods and drinks that may contain sulfites include dried fruit, potato chips, and sparkling grape juice.  Infections or inflammatory conditions such as the flu, a cold, or an inflammation of the nasal membranes (rhinitis).  Gastroesophageal reflux disease (GERD).  Exercise or strenuous activity. SYMPTOMS Symptoms may occur immediately after asthma is triggered or many hours later. Symptoms include:  Wheezing.  Excessive nighttime or early morning coughing.  Frequent or severe coughing with a common cold.  Chest tightness.  Shortness of breath. DIAGNOSIS  The diagnosis of asthma is made by a review of your medical history and a physical exam. Tests may also be performed. These may include:  Lung function studies. These tests show how much air you breath in and out.  Allergy  tests.  Imaging tests such as X-rays. TREATMENT  Asthma cannot be cured, but it can usually be controlled. Treatment involves identifying and avoiding your asthma triggers. It also involves medicines. There are 2 classes of medicine used for asthma treatment:   Controller medicines. These prevent asthma symptoms from occurring. They are usually taken every day.  Reliever or rescue medicines. These quickly relieve asthma symptoms. They are used as needed and provide short-term relief. Your health care provider will help you create an asthma action plan. An asthma action plan is a written plan for managing and treating your asthma attacks. It includes a list of your asthma triggers and how they may be avoided. It also includes information on when medicines should be taken and when their dosage should be changed. An action plan may also involve the use of a device called a peak flow meter. A peak flow meter measures how well the lungs are working. It helps you monitor your condition. HOME CARE INSTRUCTIONS   Take medicine as directed by your health care provider. Speak with your health care provider if you have questions about how or when to take the medicines.  Use a peak flow meter as directed by your health care provider. Record and keep track of readings.  Understand and use the action plan to help minimize or stop an asthma attack without needing to seek medical care.  Control your home environment in the following ways to help prevent asthma attacks:  Do not smoke. Avoid being exposed to secondhand smoke.  Change your heating and air conditioning filter regularly.  Limit your use of fireplaces and wood stoves.  Get rid of pests (such as roaches and   mice) and their droppings.  Throw away plants if you see mold on them.  Clean your floors and dust regularly. Use unscented cleaning products.  Try to have someone else vacuum for you regularly. Stay out of rooms while they are being  vacuumed and for a short while afterward. If you vacuum, use a dust mask from a hardware store, a double-layered or microfilter vacuum cleaner bag, or a vacuum cleaner with a HEPA filter.  Replace carpet with wood, tile, or vinyl flooring. Carpet can trap dander and dust.  Use allergy-proof pillows, mattress covers, and box spring covers.  Wash bed sheets and blankets every week in hot water and dry them in a dryer.  Use blankets that are made of polyester or cotton.  Clean bathrooms and kitchens with bleach. If possible, have someone repaint the walls in these rooms with mold-resistant paint. Keep out of the rooms that are being cleaned and painted.  Wash hands frequently. SEEK MEDICAL CARE IF:   You have wheezing, shortness of breath, or a cough even if taking medicine to prevent attacks.  The colored mucus you cough up (sputum) is thicker than usual.  Your sputum changes from clear or white to yellow, green, gray, or bloody.  You have any problems that may be related to the medicines you are taking (such as a rash, itching, swelling, or trouble breathing).  You are using a reliever medicine more than 2 3 times per week.  Your peak flow is still at 50 79% of you personal best after following your action plan for 1 hour. SEEK IMMEDIATE MEDICAL CARE IF:   You seem to be getting worse and are unresponsive to treatment during an asthma attack.  You are short of breath even at rest.  You get short of breath when doing very little physical activity.  You have difficulty eating, drinking, or talking due to asthma symptoms.  You develop chest pain.  You develop a fast heartbeat.  You have a bluish color to your lips or fingernails.  You are lightheaded, dizzy, or faint.  Your peak flow is less than 50% of your personal best.  You have a fever or persistent symptoms for more than 2 3 days.  You have a fever and symptoms suddenly get worse. MAKE SURE YOU:   Understand these  instructions.  Will watch your condition.  Will get help right away if you are not doing well or get worse. Document Released: 12/28/2004 Document Revised: 08/30/2012 Document Reviewed: 07/27/2012 [employer] Patient Information 2014 [employer].  

## 2013-02-13 NOTE — ED Notes (Signed)
Pt states he started getting progressively SOB two days ago. Pt states he has asthma and thinks he is having a flare up. Pt gave himself an albuterol treatment today. Pt vomited last night, denies diarrhea, fever, and body aches.
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
## 2013-06-28 ENCOUNTER — Encounter (HOSPITAL_COMMUNITY): Payer: Self-pay | Admitting: Emergency Medicine

## 2013-06-28 ENCOUNTER — Emergency Department (HOSPITAL_COMMUNITY)
Admission: EM | Admit: 2013-06-28 | Discharge: 2013-06-28 | Disposition: A | Discharge status: Home / Self Care | Payer: Self-pay | Attending: Emergency Medicine | Admitting: Emergency Medicine

## 2013-06-28 DIAGNOSIS — IMO0002 Reserved for concepts with insufficient information to code with codable children: Secondary | ICD-10-CM | POA: Insufficient documentation

## 2013-06-28 DIAGNOSIS — F17200 Nicotine dependence, unspecified, uncomplicated: Secondary | ICD-10-CM | POA: Insufficient documentation

## 2013-06-28 DIAGNOSIS — N289 Disorder of kidney and ureter, unspecified: Secondary | ICD-10-CM | POA: Insufficient documentation

## 2013-06-28 DIAGNOSIS — H81399 Other peripheral vertigo, unspecified ear: Secondary | ICD-10-CM | POA: Insufficient documentation

## 2013-06-28 DIAGNOSIS — J4489 Other specified chronic obstructive pulmonary disease: Secondary | ICD-10-CM | POA: Insufficient documentation

## 2013-06-28 DIAGNOSIS — J449 Chronic obstructive pulmonary disease, unspecified: Secondary | ICD-10-CM | POA: Insufficient documentation

## 2013-06-28 DIAGNOSIS — Z79899 Other long term (current) drug therapy: Secondary | ICD-10-CM | POA: Insufficient documentation

## 2013-06-28 LAB — I-STAT CHEM 8, ED
BUN: 16 mg/dL (ref 6–23)
CHLORIDE: 101 meq/L (ref 96–112)
Calcium, Ion: 1.21 mmol/L (ref 1.12–1.23)
Creatinine, Ser: 1.6 mg/dL — ABNORMAL HIGH (ref 0.50–1.35)
Glucose, Bld: 88 mg/dL (ref 70–99)
HEMATOCRIT: 46 % (ref 39.0–52.0)
Hemoglobin: 15.6 g/dL (ref 13.0–17.0)
POTASSIUM: 4.2 meq/L (ref 3.7–5.3)
Sodium: 139 mEq/L (ref 137–147)
TCO2: 25 mmol/L (ref 0–100)

## 2013-06-28 MED ORDER — MECLIZINE HCL 50 MG PO TABS: 50.0000 mg | ORAL_TABLET | Freq: Three times a day (TID) | ORAL | Status: DC | PRN

## 2013-06-28 MED ORDER — MECLIZINE HCL 25 MG PO TABS
25.0000 mg | ORAL_TABLET | Freq: Once | ORAL | Status: AC
  Administered 2013-06-28: 25 mg via ORAL
  Filled 2013-06-28: qty 1
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
## 2013-06-28 NOTE — ED Notes (Signed)
Pt states when he stood up after getting out of bed he felt like the room was spinning and he heard a "buzzing".  States emesis yesterday.

## 2013-06-28 NOTE — Discharge Instructions (Signed)
Please drink plenty of water and take meclizine as needed for dizziness. Your kidney function is abnormal and would need to have it rechecked next week. Use resources below to find a primary care provider. You have also had been evaluated for a mass on your right hip. This will need to be evaluated further by your primary care Dr. Please return if your symptoms worsen or if you have any other concerns.  Dizziness Dizziness is a common problem. It is a feeling of unsteadiness or light-headedness. You may feel like you are about to faint. Dizziness can lead to injury if you stumble or fall. A person of any age group can suffer from dizziness, but dizziness is more common in older adults. CAUSES  Dizziness can be caused by many different things, including:  Middle ear problems.  Standing for too long.  Infections.  An allergic reaction.  Aging.  An emotional response to something, such as the sight of blood.  Side effects of medicines.  Tiredness.  Problems with circulation or blood pressure.  Excessive use of alcohol or medicines, or illegal drug use.  Breathing too fast (hyperventilation).  An irregular heart rhythm (arrhythmia).  A low red blood cell count (anemia).  Pregnancy.  Vomiting, diarrhea, fever, or other illnesses that cause body fluid loss (dehydration).  Diseases or conditions such as Parkinson's disease, high blood pressure (hypertension), diabetes, and thyroid problems.  Exposure to extreme heat. DIAGNOSIS  Your health care provider will ask about your symptoms, perform a physical exam, and perform an electrocardiogram (ECG) to record the electrical activity of your heart. Your health care provider may also perform other heart or blood tests to determine the cause of your dizziness. These may include:  Transthoracic echocardiogram (TTE). During echocardiography, sound waves are used to evaluate how blood flows through your heart.  Transesophageal  echocardiogram (TEE).  Cardiac monitoring. This allows your health care provider to monitor your heart rate and rhythm in real time.  Holter monitor. This is a portable device that records your heartbeat and can help diagnose heart arrhythmias. It allows your health care provider to track your heart activity for several days if needed.  Stress tests by exercise or by giving medicine that makes the heart beat faster. TREATMENT  Treatment of dizziness depends on the cause of your symptoms and can vary greatly. HOME CARE INSTRUCTIONS   Drink enough fluids to keep your urine clear or pale yellow. This is especially important in very hot weather. In older adults, it is also important in cold weather.  Take your medicine exactly as directed if your dizziness is caused by medicines. When taking blood pressure medicines, it is especially important to get up slowly.  Rise slowly from chairs and steady yourself until you feel okay.  In the morning, first sit up on the side of the bed. When you feel okay, stand slowly while holding onto something until you know your balance is fine.  Move your legs often if you need to stand in one place for a long time. Tighten and relax your muscles in your legs while standing.  Have someone stay with you for 1-2 days if dizziness continues to be a problem. Do this until you feel you are well enough to stay alone. Have the person call your health care provider if he or she notices changes in you that are concerning.  Do not drive or use heavy machinery if you feel dizzy.  Do not drink alcohol. [place]  IF:   Your dizziness or light-headedness gets worse.  You feel nauseous or vomit.  You have problems talking, walking, or using your arms, hands, or legs.  You feel weak.  You are not thinking clearly or you have trouble forming sentences. It may take a friend or family member to notice this.  You have chest pain, abdominal pain,  shortness of breath, or sweating.  Your vision changes.  You notice any bleeding.  You have side effects from medicine that seems to be getting worse rather than better. MAKE SURE YOU:   Understand these instructions.  Will watch your condition.  Will get help right away if you are not doing well or get worse. Document Released: 06/23/2000 Document Revised: 01/02/2013 Document Reviewed: 07/17/2010 [facility] Patient Information 2015 [place], [place]. This information is not intended to replace advice given to you by your health care provider. Make sure you discuss any questions you have with your health care provider.   Emergency Department Resource Guide 1) Find a Doctor and Pay Out of Pocket Although you won't have to find out who is covered by your insurance plan, it is a good idea to ask around and get recommendations. You will then need to call the office and see if the doctor you have chosen will accept you as a new patient and what types of options they offer for patients who are self-pay. Some doctors offer discounts or will set up payment plans for their patients who do not have insurance, but you will need to ask so you aren't surprised when you get to your appointment.  2) Contact Your Local Health Department Not all health departments have doctors that can see patients for sick visits, but many do, so it is worth a call to see if yours does. If you don't know where your local health department is, you can check in your phone book. The CDC also has a tool to help you locate your state's health department, and many state websites also have listings of all of their local health departments.  3) Find a [place] [facility] If your illness is not likely to be very severe or complicated, you may want to try a walk in clinic. These are popping up all over the country in pharmacies, drugstores, and shopping centers. They're usually staffed by nurse practitioners or physician assistants that have  been trained to treat common illnesses and complaints. They're usually fairly quick and inexpensive. However, if you have serious medical issues or chronic medical problems, these are probably not your best option.  No Primary Care Doctor: - Call Health Connect at  [phone] - they can help you locate a primary care doctor that  accepts your insurance, provides certain services, etc. - Physician Referral Service- [phone]  Chronic Pain Problems: Organization         Address  Phone   Notes  [facility]  [phone] Patients need to be referred by their primary care doctor.   Medication Assistance: Organization         Address  Phone   Notes  [facility] Medication [facility] [address] [place] [place], [place] [address] [phone] --Must be a resident of [facility] -- Must have NO insurance coverage whatsoever (no Medicaid/ Medicare, etc.) -- The pt. MUST have a primary care doctor that directs their care regularly and follows them in the community   MedAssist  [phone]   [address]  [phone]  Agencies that provide inexpensive medical care: Organization         Address  Phone   Notes  [place]  [phone]   [facility]    [phone]   [facility] [place], [place] [address] [phone]   [place] [address] [place] [phone]   Planned Parenthood    [phone]   [facility]    [phone]   [place] and [place] [address], [place] Phone:  [phone], Fax:  [phone] Hours of Operation:  9 am - 6 pm, M-F.  Also accepts Medicaid/Medicare and self-pay.  [facility] for [place] [address], [address], [place] Phone: [phone], Fax: [phone]. Hours of Operation:  8:30 am - 5:30 pm, M-F.  Also accepts Medicaid and  self-pay.  [facility] [place] [address] [place] Phone: [phone]   [place], [place], [place] [phone], Ext. [phone] Mondays & Thursdays: 7-9 AM.  First 15 patients are seen on a first come, first serve basis.    [place] Providers:  Organization         Address  Phone   Notes  [facility] [address], [place] [phone] Also accepts self-pay patients.  [facility] [address], [place]  [phone]   [place], [address], [place] [phone]   [facility] [address], [place] [phone]   Lucianne Lei [address], [place]   [phone] Only accepts [place] Access [place] patients after they have their name applied to their card.   Self-Pay (no insurance) in [facility]:  Organization         Address  Phone   Notes  Sickle Cell Patients, [facility] [place] [phone]   [facility] Urgent Care [place] [phone]   [facility] [place]  [place], [place], [place] [phone]   [facility]/Dr. Osei-Bonsu  [address], [place] or [address], [place] [phone] Phone number for both [place] and [place] locations is the same.  Urgent Medical and [facility] [address] [place] [phone]   [facility] [address] [place] or [address] [phone]   [facility] [address] [place] [phone], phone; [phone], fax Sees patients 1st and 3rd Saturday of every month.  Must not qualify for public or private insurance (i.e. Medicaid, Medicare, [place] Health Choice, Veterans' Benefits)  Household income  should be no more than 200% of the poverty level The clinic cannot treat you if you are pregnant or think you are pregnant  Sexually transmitted diseases are not treated at the clinic.    Dental Care: Organization         Address  Phone  Notes  [facility] Department of [facility] [place] [phone] Accepts children up to age 23 who are enrolled in [place] or [place]; pregnant women with a Medicaid card; and children who have applied for Medicaid or  [place] Health Choice, but were declined, whose parents can pay a reduced fee at time of service.  [facility] Department of [facility]  [address], [place] [phone] Accepts children up to age 87 who are enrolled in [place] or [place]; pregnant women with a Medicaid card; and children who have applied for Medicaid or [place] Health Choice, but were declined, whose parents can pay a reduced fee at time of service.  [facility] Adult Dental Access PROGRAM  [place] [phone] Patients are seen by appointment only. Walk-ins are not accepted. [place] will see patients 36 years of age and older. Monday - Tuesday (8am-5pm) Most Wednesdays (8:30-5pm) $30 per visit, cash only  [facility] Adult Dental Access PROGRAM  [address], [facility] [phone] Patients are seen by appointment only. Walk-ins are not accepted. [place] will see patients 12 years of age and older. One Wednesday Evening (Monthly: Volunteer Based).  $30 per visit, cash only  [place]  [phone] for adults; Children under age 64, call Graduate Pediatric Dentistry at [phone]. Children aged 8-14, please call [phone] to request a pediatric application.  Dental services are provided in all areas of dental care including fillings, crowns and bridges, complete and partial dentures, implants, gum treatment,  root canals, and extractions. Preventive care is also provided. Treatment is provided to both adults and children. Patients are selected via a lottery and there is often a waiting list.   [facility] [address], [place]  [phone] [email or web address]   [facility] [address] [place], [place] [phone], Ext. [phone] Second and Fourth Thursday of each month, opens at 6:30 AM; Clinic ends at 9 AM.  Patients are seen on a first-come first-served basis, and a limited number are seen during each clinic.   [facility]  [address] Hillard Danker [place], [place] [phone]   Eligibility Requirements You must have lived in [place], [place], or [place] counties for at least the last three months.   You cannot be eligible for state or federal sponsored [employer], including [employer], [place], or [employer].   You generally cannot be eligible for healthcare insurance through your employer.    How to apply: Eligibility screenings are held every Tuesday and Wednesday afternoon from 1:00 pm until 4:00 pm. You do not need an appointment for the interview!  [facility] [address], [place], [place]   [place]  [place] Department  [place]  [phone]    Behavioral Health Resources in the Community: Intensive Outpatient Programs Organization         Address  Phone  Notes  [place]. [address] [place], [place] [phone]   [facility] Outpatient [address] [place], [place]   ADS: Alcohol & Drug [facility] [address] [place], [place]   [place] [address]  [place], [place] or [phone]   Substance Abuse Resources Organization         Address  Phone  Notes  Alcohol and Drug Services   [phone]   [facility]  [phone]   [facility] [place]  [phone]   Chinita Pester  [phone]   Residential & Outpatient Substance Abuse Program  [phone]  Psychological Services Organization         Address  Phone  Notes  [facility]  [place]  [phone]   [place] [address] [place] or [phone]    Mobile Crisis Teams Organization         Address  Phone  Notes  [facility], Mobile Crisis Care Unit  [phone]   [facility] Psychotherapeutic Services  [address] [place], [place]   [place] [address] [place] [phone]    Self-Help/Support Groups Organization         Address  Phone             Notes  [place]. of [place] - variety of support groups  [place] Call for more information  Narcotics Anonymous (NA), [facility] [address], [employer] [place]  2 meetings at this location   Special educational needs teacher         Address  Phone  Notes  [facility]idential Treatment [place],    [place]  [phone]   [facility]  [address] [place] 676195, [place], [place]   [place], [place] [phone] Admissions: 8am-3pm M-F  Incentives Substance [place] [address]    [place], [place] [phone]   [facility] [address] [place], [place], [place]   [facility] [address],  [place], [place]   [facility] Programs - Intensive Outpatient [address] Kristeen [address], [place], [place]   [facility].,  [place], [place] [phone] or [phone]   [facility] Services (RTS) [address], [place], [place] Accepts Medicaid  [place] [address]  [place] [phone] Substance Abuse/Addiction Treatment   [facility] Organization         Address  Phone  Notes  [facility]  [phone]   Domenic Schwab, PhD [address] Arlis Porta [place], [place]   [phone] or [phone]   [facility] [place], [place] [phone]   [facility] [address] [place], [place] [phone] Insurance/Medicaid/sponsorship through [facility] and Families [address] [place]                                    [place], [place] [phone] [place] [address], [place] [phone]    Dr. Adele Schilder  [phone]   [facility] Dept. 1) [address] [place] 2) [place] 3)  [place], [place] [phone]  [phone]   [place] [phone] or [phone] (After Hours)

## 2013-06-28 NOTE — ED Provider Notes (Signed)
Medical screening examination/treatment/procedure(s) were conducted as a shared visit with non-physician practitioner(s) and myself.  I personally evaluated the patient during the encounter.   EKG Interpretation   Date/Time:  Thursday June 28 2013 10:09:59 EDT Ventricular Rate:  69 PR Interval:  146 QRS Duration: 80 QT Interval:  390 QTC Calculation: 417 R Axis:   61 Text Interpretation:  Sinus rhythm with marked sinus arrhythmia Otherwise  normal ECG Confirmed by WARD,  DO, KRISTEN ([id number]) on 06/28/2013 10:22:14  AM      Pt is a 48 y.o. M with history of COPD who presents emergency department with vertigo that started when he got out of bed today. Worse with changes in position. No numbness, tingling or focal weakness. No headache. No head injury. Not on anticoagulation. Suspect peripheral vertigo. Symptoms improved with meclizine. We'll discharge with prescription for the same, instructions on Apley's maneuver. 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 He denies any significant weight loss, changes in appetite, fevers or chills. 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 No sign of overlying infection. We'll give surgery outpatient followup information.  [place], DO 06/28/13 1237

## 2013-06-28 NOTE — ED Notes (Signed)
Pt ambulated well with no issues

## 2013-11-02 ENCOUNTER — Emergency Department (INDEPENDENT_AMBULATORY_CARE_PROVIDER_SITE_OTHER)
Admission: EM | Admit: 2013-11-02 | Discharge: 2013-11-02 | Disposition: A | Discharge status: Home / Self Care | Payer: Self-pay | Source: Home / Self Care | Attending: Emergency Medicine | Admitting: Emergency Medicine

## 2013-11-02 ENCOUNTER — Encounter (HOSPITAL_COMMUNITY): Payer: Self-pay | Admitting: Emergency Medicine

## 2013-11-02 DIAGNOSIS — J069 Acute upper respiratory infection, unspecified: Secondary | ICD-10-CM

## 2013-11-02 DIAGNOSIS — J4520 Mild intermittent asthma, uncomplicated: Secondary | ICD-10-CM

## 2013-11-02 MED ORDER — ALBUTEROL SULFATE (2.5 MG/3ML) 0.083% IN NEBU
5.0000 mg | INHALATION_SOLUTION | Freq: Once | RESPIRATORY_TRACT | Status: AC
  Administered 2013-11-02: 5 mg via RESPIRATORY_TRACT

## 2013-11-02 MED ORDER — PREDNISONE 20 MG PO TABS
60.0000 mg | ORAL_TABLET | Freq: Once | ORAL | Status: AC
Start: 2013-11-02 — End: 2013-11-02
  Administered 2013-11-02: 60 mg via ORAL

## 2013-11-02 MED ORDER — PREDNISONE 50 MG PO TABS: 50.0000 mg | ORAL_TABLET | Freq: Every day | ORAL | Status: DC

## 2013-11-02 MED ORDER — IPRATROPIUM BROMIDE 0.02 % IN SOLN
0.5000 mg | Freq: Once | RESPIRATORY_TRACT | Status: AC
  Administered 2013-11-02: 0.5 mg via RESPIRATORY_TRACT

## 2013-11-02 MED ORDER — IPRATROPIUM BROMIDE 0.02 % IN SOLN
RESPIRATORY_TRACT | Status: AC
  Filled 2013-11-02: qty 2.5

## 2013-11-02 MED ORDER — ALBUTEROL SULFATE HFA 108 (90 BASE) MCG/ACT IN AERS
2.0000 | INHALATION_SPRAY | RESPIRATORY_TRACT | Status: DC | PRN
  Administered 2013-11-02: 2 via RESPIRATORY_TRACT

## 2013-11-02 MED ORDER — ALBUTEROL SULFATE (2.5 MG/3ML) 0.083% IN NEBU: 2.5000 mg | INHALATION_SOLUTION | RESPIRATORY_TRACT | Status: DC | PRN

## 2013-11-02 MED ORDER — PREDNISONE 20 MG PO TABS
ORAL_TABLET | ORAL | Status: AC
  Filled 2013-11-02: qty 3

## 2013-11-02 MED ORDER — ALBUTEROL SULFATE HFA 108 (90 BASE) MCG/ACT IN AERS
INHALATION_SPRAY | RESPIRATORY_TRACT | Status: AC
  Filled 2013-11-02: qty 6.7

## 2013-11-02 MED ORDER — ALBUTEROL SULFATE (2.5 MG/3ML) 0.083% IN NEBU
INHALATION_SOLUTION | RESPIRATORY_TRACT | Status: AC
  Filled 2013-11-02: qty 6

## 2013-11-02 NOTE — Discharge Instructions (Signed)
Bronchospasm °A bronchospasm is a spasm or tightening of the airways going into the lungs. During a bronchospasm breathing becomes more difficult because the airways get smaller. When this happens there can be coughing, a whistling sound when breathing (wheezing), and difficulty breathing. Bronchospasm is often associated with asthma, but not all patients who experience a bronchospasm have asthma. °CAUSES  °A bronchospasm is caused by inflammation or irritation of the airways. The inflammation or irritation may be triggered by:  °· Allergies (such as to animals, pollen, food, or mold). Allergens that cause bronchospasm may cause wheezing immediately after exposure or many hours later.   °· Infection. Viral infections are believed to be the most common cause of bronchospasm.   °· Exercise.   °· Irritants (such as pollution, cigarette smoke, strong odors, aerosol sprays, and paint fumes).   °· Weather changes. Winds increase molds and pollens in the air. Rain refreshes the air by washing irritants out. Cold air may cause inflammation.   °· Stress and emotional upset.   °SIGNS AND SYMPTOMS  °· Wheezing.   °· Excessive nighttime coughing.   °· Frequent or severe coughing with a simple cold.   °· Chest tightness.   °· Shortness of breath.   °DIAGNOSIS  °Bronchospasm is usually diagnosed through a history and physical exam. Tests, such as chest X-rays, are sometimes done to look for other conditions. °TREATMENT  °· Inhaled medicines can be given to open up your airways and help you breathe. The medicines can be given using either an inhaler or a nebulizer machine. °· Corticosteroid medicines may be given for severe bronchospasm, usually when it is associated with asthma. °HOME CARE INSTRUCTIONS  °· Always have a plan prepared for seeking medical care. Know when to call your health care provider and local emergency services (911 in the U.S.). Know where you can access local emergency care. °· Only take medicines as  directed by your health care provider. °· If you were prescribed an inhaler or nebulizer machine, ask your health care provider to explain how to use it correctly. Always use a spacer with your inhaler if you were given one. °· It is necessary to remain calm during an attack. Try to relax and breathe more slowly.  °· Control your home environment in the following ways:   °¨ Change your heating and air conditioning filter at least once a month.   °¨ Limit your use of fireplaces and wood stoves. °¨ Do not smoke and do not allow smoking in your home.   °¨ Avoid exposure to perfumes and fragrances.   °¨ Get rid of pests (such as roaches and mice) and their droppings.   °¨ Throw away plants if you see mold on them.   °¨ Keep your house clean and dust free.   °¨ Replace carpet with wood, tile, or vinyl flooring. Carpet can trap dander and dust.   °¨ Use allergy-proof pillows, mattress covers, and box spring covers.   °¨ Wash bed sheets and blankets every week in hot water and dry them in a dryer.   °¨ Use blankets that are made of polyester or cotton.   °¨ Wash hands frequently. °SEEK MEDICAL CARE IF:  °· You have muscle aches.   °· You have chest pain.   °· The sputum changes from clear or white to yellow, green, gray, or bloody.   °· The sputum you cough up gets thicker.   °· There are problems that may be related to the medicine you are given, such as a rash, itching, swelling, or trouble breathing.   °SEEK IMMEDIATE MEDICAL CARE IF:  °· You have worsening wheezing and coughing even   after taking your prescribed medicines.   °· You have increased difficulty breathing.   °· You develop severe chest pain. °MAKE SURE YOU:  °· Understand these instructions. °· Will watch your condition. °· Will get help right away if you are not doing well or get worse. °Document Released: 12/31/2002 Document Revised: 01/02/2013 Document Reviewed: 06/19/2012 °ExitCare® Patient Information ©2015 [employer]. This information is not  intended to replace advice given to you by your health care provider. Make sure you discuss any questions you have with your health care provider. ° °

## 2013-11-02 NOTE — ED Provider Notes (Signed)
Medical screening examination/treatment/procedure(s) were performed by non-physician practitioner and as supervising physician I was immediately available for consultation/collaboration.  Philipp Deputy, M.D.  [place], MD 11/02/13 [phone]
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
## 2014-03-10 ENCOUNTER — Encounter (HOSPITAL_COMMUNITY): Payer: Self-pay | Admitting: *Deleted

## 2014-03-10 ENCOUNTER — Emergency Department (HOSPITAL_COMMUNITY)
Admission: EM | Admit: 2014-03-10 | Discharge: 2014-03-10 | Disposition: A | Discharge status: Home / Self Care | Payer: Self-pay | Attending: Emergency Medicine | Admitting: Emergency Medicine

## 2014-03-10 DIAGNOSIS — Y998 Other external cause status: Secondary | ICD-10-CM | POA: Insufficient documentation

## 2014-03-10 DIAGNOSIS — S86811A Strain of other muscle(s) and tendon(s) at lower leg level, right leg, initial encounter: Secondary | ICD-10-CM | POA: Insufficient documentation

## 2014-03-10 DIAGNOSIS — Y939 Activity, unspecified: Secondary | ICD-10-CM | POA: Insufficient documentation

## 2014-03-10 DIAGNOSIS — Z79899 Other long term (current) drug therapy: Secondary | ICD-10-CM | POA: Insufficient documentation

## 2014-03-10 DIAGNOSIS — S86111A Strain of other muscle(s) and tendon(s) of posterior muscle group at lower leg level, right leg, initial encounter: Secondary | ICD-10-CM

## 2014-03-10 DIAGNOSIS — Z792 Long term (current) use of antibiotics: Secondary | ICD-10-CM | POA: Insufficient documentation

## 2014-03-10 DIAGNOSIS — X58XXXA Exposure to other specified factors, initial encounter: Secondary | ICD-10-CM | POA: Insufficient documentation

## 2014-03-10 DIAGNOSIS — Y929 Unspecified place or not applicable: Secondary | ICD-10-CM | POA: Insufficient documentation

## 2014-03-10 DIAGNOSIS — J449 Chronic obstructive pulmonary disease, unspecified: Secondary | ICD-10-CM | POA: Insufficient documentation

## 2014-03-10 DIAGNOSIS — Z7952 Long term (current) use of systemic steroids: Secondary | ICD-10-CM | POA: Insufficient documentation

## 2014-03-10 DIAGNOSIS — Z720 Tobacco use: Secondary | ICD-10-CM | POA: Insufficient documentation

## 2014-03-10 MED ORDER — IBUPROFEN 800 MG PO TABS: 800.0000 mg | ORAL_TABLET | Freq: Three times a day (TID) | ORAL | Status: DC | PRN

## 2014-03-10 MED ORDER — HYDROCODONE-ACETAMINOPHEN 5-325 MG PO TABS: 1.0000 | ORAL_TABLET | Freq: Four times a day (QID) | ORAL | Status: DC | PRN

## 2014-03-10 MED ORDER — HYDROCODONE-ACETAMINOPHEN 5-325 MG PO TABS
1.0000 | ORAL_TABLET | Freq: Once | ORAL | Status: AC
  Administered 2014-03-10: 1 via ORAL
  Filled 2014-03-10: qty 1
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
## 2014-03-10 NOTE — ED Notes (Signed)
Patient states he stepped down off the front porch and missed a step hurting his right leg.  Stated after he did this he could not walk on his leg.

## 2014-03-10 NOTE — Discharge Instructions (Signed)
Wear cam walker daily, use crutches to move around.  Keep your leg elevated as often as possible.  Follow up with orthopedist specialist if no improvement after 1-2 weeks as you may need further evaluation and treatment including MRI.  Medial Head Gastrocnemius Tear (Tennis Leg), with Rehab Medial head gastrocnemius tear, also called tennis leg, is a tear (strain) in a muscle or tendon of the inner portion (medial head) of one of the calf muscles (gastrocnemius). The inner portion of the calf muscle attaches to the thigh bone (femur) and is responsible for bending the knee and straightening the foot (standing "on tiptoe"). Strains are classified into three categories. Grade 1 strains cause pain, but the tendon is not lengthened. Grade 2 strains include a lengthened ligament, due to the ligament being stretched or partially ruptured. With grade 2 strains there is still function, although function may be decreased. Grade 3 strains involve a complete tear of the tendon or muscle, and function is usually impaired. SYMPTOMS   Sudden "pop" or tear felt at the time of injury.  Pain, tenderness, swelling, warmth, or redness over the middle inner calf.  Pain and weakness with ankle motion, especially flexing the ankle against resistance, as well as pain with lifting up the foot (extending the ankle).  Bruising (contusion) of the calf, heel, and sometimes the foot within 48 hours of injury.  Muscle spasm in the calf. CAUSES  Muscle and ligament strains occur when a force is placed on the muscle or ligament that is greater than it can handle. Common causes of injury include:  Direct hit (trauma) to the calf.  Sudden forceful pushing off or landing on the foot (jumping, landing, serving a tennis ball, lunging). RISK INCREASES WITH:  Sports that require sudden, explosive calf muscle contraction, such as those involving jumping (basketball), hill running, quick starts (running), or lunging (racquetball,  tennis).  Contact sports (football, soccer, hockey).  Poor strength and flexibility.  Previous lower limb injury. PREVENTION  Warm up and stretch properly before activity.  Allow for adequate recovery between workouts.  Maintain physical fitness:  Strength, flexibility, and endurance.  Cardiovascular fitness.  Learn and use proper exercise technique.  Complete rehabilitation after lower limb injury, before returning to competition or practice. PROGNOSIS  If treated properly, tennis leg usually heals within 6 weeks of nonsurgical treatment.  RELATED COMPLICATIONS   Longer healing time, if not properly treated or if not given enough time to heal.  Recurring symptoms and injury, if activity is resumed too soon, with overuse, with a direct blow, or with poor technique.  If untreated, may progress to a complete tear (rare) or other injury, due to limping and favoring of the injured leg.  Persistent limping, due to scarring and shortening of the calf muscles, as a result of inadequate rehabilitation.  Prolonged disability. TREATMENT  Treatment first involves the use of ice and medication to help reduce pain and inflammation. The use of strengthening and stretching exercises may help reduce pain with activity. These exercises may be performed at home or with a therapist. For severe injuries, referral to a therapist may be needed for further evaluation and treatment. Your caregiver may advise that you wear a brace to help healing. Sometimes, crutches are needed until you can walk without limping. Rarely, surgery is needed.  MEDICATION   If pain medicine is needed, nonsteroidal anti-inflammatory medicines (aspirin and ibuprofen), or other minor pain relievers (acetaminophen), are often advised.  Do not take pain medicine for 7 days  before surgery.  Prescription pain relievers may be given, if your caregiver thinks they are needed. Use only as directed and only as much as you  need. HEAT AND COLD  Cold treatment (icing) should be applied for 10 to 15 minutes every 2 to 3 hours for inflammation and pain, and immediately after activity that aggravates your symptoms. Use ice packs or an ice massage.  Heat treatment may be used before performing stretching and strengthening activities prescribed by your caregiver, physical therapist, or athletic trainer. Use a heat pack or a warm water soak. SEEK MEDICAL CARE IF:   Symptoms get worse or do not improve in 2 weeks, despite treatment.  Numbness or tingling develops.  New, unexplained symptoms develop. (Drugs used in treatment may produce side effects.) EXERCISES  RANGE OF MOTION (ROM) AND STRETCHING EXERCISES - Medial Head Gastrocnemius Tear (Tennis Leg) These exercises may help you when beginning to rehabilitate your injury. Your symptoms may resolve with or without further involvement from your physician, physical therapist, or athletic trainer. While completing these exercises, remember:   Restoring tissue flexibility helps normal motion to return to the joints. This allows healthier, less painful movement and activity.  An effective stretch should be held for at least 30 seconds.  A stretch should never be painful. You should only feel a gentle lengthening or release in the stretched tissue. STRETCH - Gastrocsoleus  Sit with your right / left leg extended. Holding onto both ends of a belt or towel, loop it around the ball of your foot.  Keeping your right / left ankle and foot relaxed and your knee straight, pull your foot and ankle toward you using the belt.  You should feel a gentle stretch behind your calf or knee. Hold this position for __________ seconds. Repeat __________ times. Complete this stretch __________ times per day.  RANGE OF MOTION - Ankle Dorsiflexion, Active Assisted   Remove your shoes and sit on a chair, preferably not on a carpeted surface.  Place your right / left foot directly under  the knee. Extend your opposite leg for support.  Keeping your heel down, slide your right / left foot back toward the chair, until you feel a stretch at your ankle or calf. If you do not feel a stretch, slide your bottom forward to the edge of the chair, while still keeping your heel down.  Hold this stretch for __________ seconds. Repeat __________ times. Complete this stretch __________ times per day.  STRETCH - Gastroc, Standing   Place your hands on a wall.  Extend your right / left leg behind you, keeping the front knee somewhat bent.  Slightly point your toes inward on your back foot.  Keeping your right / left heel on the floor and your knee straight, shift your weight toward the wall, not allowing your back to arch.  You should feel a gentle stretch in the right / left calf. Hold this position for __________ seconds. Repeat __________ times. Complete this stretch __________ times per day. STRETCH - Soleus, Standing   Place your hands on a wall.  Extend your right / left leg behind you, keeping the other knee somewhat bent.  Point your toes of your back foot slightly inward.  Keep your right / left heel on the floor, bend your back knee, and slightly shift your weight over the back leg so that you feel a gentle stretch deep in your back calf.  Hold this position for __________ seconds. Repeat __________ times. Complete  this stretch __________ times per day. STRETCH - Gastrocsoleus, Standing Note: This exercise can place a lot of stress on your foot and ankle. Please complete this exercise only if specifically instructed by your caregiver.   Place the ball of your right / left foot on a step, keeping your other foot firmly on the same step.  Hold on to the wall or a rail for balance.  Slowly lift your other foot, allowing your body weight to press your heel down over the edge of the step.  You should feel a stretch in your right / left calf.  Hold this position for  __________ seconds.  Repeat this exercise with a slight bend in your right / left knee. Repeat __________ times. Complete this stretch __________ times per day.  STRENGTHENING EXERCISES - Medial Head Gastrocnemius Tear (Tennis Leg) These exercises may help you when beginning to rehabilitate your injury. They may resolve your symptoms with or without further involvement from your physician, physical therapist, or athletic trainer. While completing these exercises, remember:   Muscles can gain both the endurance and the strength needed for everyday activities through controlled exercises.  Complete these exercises as instructed by your physician, physical therapist, or athletic trainer. Increase the resistance and repetitions only as guided by your caregiver. STRENGTH - Plantar-flexors  Sit with your right / left leg extended. Holding onto both ends of a rubber exercise band or tubing, loop it around the ball of your foot. Keep a slight tension in the band.  Slowly push your toes away from you, pointing them downward.  Hold this position for __________ seconds. Return slowly, controlling the tension in the band. Repeat __________ times. Complete this exercise __________ times per day.  STRENGTH - Plantar-flexors  Stand with your feet shoulder width apart. Steady yourself with a wall or table, using as little support as needed.  Keeping your weight evenly spread over the width of your feet, rise up on your toes.*  Hold this position for __________ seconds. Repeat __________ times. Complete this exercise __________ times per day.  *If this is too easy, shift your weight toward your right / left leg until you feel challenged. Ultimately, you may be asked to do this exercise while standing on your right / left foot only. STRENGTH - Plantar-flexors, Eccentric Note: This exercise can place a lot of stress on your foot and ankle. Please complete this exercise only if specifically instructed by  your caregiver.   Place the balls of your feet on a step. With your hands, use only enough support from a wall or rail to keep your balance.  Keep your knees straight and rise up on your toes.  Slowly shift your weight entirely to your right / left toes and pick up your opposite foot. Gently and with controlled movement, lower your weight through your right / left foot so that your heel drops below the level of the step. You will feel a slight stretch in the back of your right / left calf.  Use the healthy leg to help rise up onto the balls of both feet, then lower weight only onto the right / left leg again. Build up to 15 repetitions. Then progress to 3 sets of 15 repetitions.*  After completing the above exercise, complete the same exercise with a slight knee bend (about 30 degrees). Again, build up to 15 repetitions. Then progress to 3 sets of 15 repetitions.* Perform this exercise __________ times per day.  *When you easily complete  3 sets of 15, your physician, physical therapist, or athletic trainer may advise you to add resistance, by wearing a backpack filled with additional weight. Document Released: 12/28/2004 Document Revised: 05/14/2013 Document Reviewed: 04/11/2008 [facility] Patient Information 2015 [place], [place]. This information is not intended to replace advice given to you by your health care provider. Make sure you discuss any questions you have with your health care provider.

## 2014-03-10 NOTE — ED Provider Notes (Signed)
The patient is a 49 year old male who suffered an injury to the right calf while he stepped awkwardly. He suffered acute onset of pain which has been persistent, associated with swelling of the right calf posteriorly. On exam the patient does have tenderness and swelling asymmetrically, he is able to dorsiflex and plantarflex though it causes pain in both directions. The compartments are soft, pulses are preserved at the foot, normal sensation at the foot. I suspect a partial gastrocnemius soleus or plantaris tear. He will placed in a cam walker, crutches, anti-inflammatories and ice therapy. He was informed of the plan and is agreeable.  Medical screening examination/treatment/procedure(s) were conducted as a shared visit with non-physician practitioner(s) and myself.  I personally evaluated the patient during the encounter.  Clinical Impression:   Final diagnoses:  Gastrocnemius tear, right, initial encounter         Johnna Acosta, MD 03/11/14 2023

## 2015-02-19 ENCOUNTER — Emergency Department (HOSPITAL_COMMUNITY)
Admission: EM | Admit: 2015-02-19 | Discharge: 2015-02-19 | Disposition: A | Discharge status: Home / Self Care | Payer: Self-pay | Attending: Emergency Medicine | Admitting: Emergency Medicine

## 2015-02-19 ENCOUNTER — Encounter (HOSPITAL_COMMUNITY): Payer: Self-pay | Admitting: Emergency Medicine

## 2015-02-19 DIAGNOSIS — Z7952 Long term (current) use of systemic steroids: Secondary | ICD-10-CM | POA: Insufficient documentation

## 2015-02-19 DIAGNOSIS — D179 Benign lipomatous neoplasm, unspecified: Secondary | ICD-10-CM

## 2015-02-19 DIAGNOSIS — J449 Chronic obstructive pulmonary disease, unspecified: Secondary | ICD-10-CM | POA: Insufficient documentation

## 2015-02-19 DIAGNOSIS — F17210 Nicotine dependence, cigarettes, uncomplicated: Secondary | ICD-10-CM | POA: Insufficient documentation

## 2015-02-19 DIAGNOSIS — Z792 Long term (current) use of antibiotics: Secondary | ICD-10-CM | POA: Insufficient documentation

## 2015-02-19 DIAGNOSIS — D1723 Benign lipomatous neoplasm of skin and subcutaneous tissue of right leg: Secondary | ICD-10-CM | POA: Insufficient documentation

## 2015-02-19 LAB — BASIC METABOLIC PANEL WITH GFR
Anion gap: 10 (ref 5–15)
BUN: 11 mg/dL (ref 6–20)
CO2: 25 mmol/L (ref 22–32)
Calcium: 8.9 mg/dL (ref 8.9–10.3)
Chloride: 104 mmol/L (ref 101–111)
Creatinine, Ser: 1.63 mg/dL — ABNORMAL HIGH (ref 0.61–1.24)
GFR calc Af Amer: 56 mL/min — ABNORMAL LOW
GFR calc non Af Amer: 49 mL/min — ABNORMAL LOW
Glucose, Bld: 109 mg/dL — ABNORMAL HIGH (ref 65–99)
Potassium: 3.9 mmol/L (ref 3.5–5.1)
Sodium: 139 mmol/L (ref 135–145)

## 2015-02-19 LAB — CBC WITH DIFFERENTIAL/PLATELET
Basophils Absolute: 0 K/uL (ref 0.0–0.1)
Basophils Relative: 1 %
Eosinophils Absolute: 0.2 K/uL (ref 0.0–0.7)
Eosinophils Relative: 4 %
HCT: 42.2 % (ref 39.0–52.0)
Hemoglobin: 14.4 g/dL (ref 13.0–17.0)
Lymphocytes Relative: 31 %
Lymphs Abs: 1.4 K/uL (ref 0.7–4.0)
MCH: 31 pg (ref 26.0–34.0)
MCHC: 34.1 g/dL (ref 30.0–36.0)
MCV: 90.8 fL (ref 78.0–100.0)
Monocytes Absolute: 0.6 K/uL (ref 0.1–1.0)
Monocytes Relative: 13 %
Neutro Abs: 2.3 K/uL (ref 1.7–7.7)
Neutrophils Relative %: 51 %
Platelets: 178 K/uL (ref 150–400)
RBC: 4.65 MIL/uL (ref 4.22–5.81)
RDW: 13.1 % (ref 11.5–15.5)
WBC: 4.5 K/uL (ref 4.0–10.5)

## 2015-02-19 MED ORDER — HYDROCODONE-ACETAMINOPHEN 5-325 MG PO TABS: 1.0000 | ORAL_TABLET | Freq: Four times a day (QID) | ORAL | Status: DC | PRN

## 2015-02-19 MED ORDER — HYDROCODONE-ACETAMINOPHEN 5-325 MG PO TABS
2.0000 | ORAL_TABLET | Freq: Once | ORAL | Status: AC
  Administered 2015-02-19: 2 via ORAL
  Filled 2015-02-19: qty 2

## 2015-02-19 NOTE — Discharge Instructions (Signed)
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
## 2015-02-19 NOTE — ED Notes (Signed)
Patient states "tumor on R hip".   Patient states he has had it for 15 years and is having increased pain.   Patient states having trouble walking.

## 2015-02-19 NOTE — ED Notes (Signed)
See PA assessment 

## 2015-02-27 ENCOUNTER — Encounter: Payer: Self-pay | Admitting: Family Medicine

## 2015-02-27 ENCOUNTER — Ambulatory Visit: Payer: Self-pay | Attending: Family Medicine | Admitting: Family Medicine

## 2015-02-27 VITALS — BP 134/83 | HR 83 | Temp 98.1°F | Resp 16 | Ht 71.0 in | Wt 227.0 lb

## 2015-02-27 DIAGNOSIS — Z23 Encounter for immunization: Secondary | ICD-10-CM | POA: Insufficient documentation

## 2015-02-27 DIAGNOSIS — J4521 Mild intermittent asthma with (acute) exacerbation: Secondary | ICD-10-CM

## 2015-02-27 DIAGNOSIS — J45901 Unspecified asthma with (acute) exacerbation: Secondary | ICD-10-CM | POA: Insufficient documentation

## 2015-02-27 DIAGNOSIS — Z131 Encounter for screening for diabetes mellitus: Secondary | ICD-10-CM

## 2015-02-27 DIAGNOSIS — J40 Bronchitis, not specified as acute or chronic: Secondary | ICD-10-CM | POA: Insufficient documentation

## 2015-02-27 DIAGNOSIS — F17210 Nicotine dependence, cigarettes, uncomplicated: Secondary | ICD-10-CM | POA: Insufficient documentation

## 2015-02-27 DIAGNOSIS — R05 Cough: Secondary | ICD-10-CM | POA: Insufficient documentation

## 2015-02-27 DIAGNOSIS — J45909 Unspecified asthma, uncomplicated: Secondary | ICD-10-CM | POA: Insufficient documentation

## 2015-02-27 DIAGNOSIS — J441 Chronic obstructive pulmonary disease with (acute) exacerbation: Secondary | ICD-10-CM

## 2015-02-27 DIAGNOSIS — D1779 Benign lipomatous neoplasm of other sites: Secondary | ICD-10-CM | POA: Insufficient documentation

## 2015-02-27 DIAGNOSIS — D1723 Benign lipomatous neoplasm of skin and subcutaneous tissue of right leg: Secondary | ICD-10-CM

## 2015-02-27 DIAGNOSIS — Z0000 Encounter for general adult medical examination without abnormal findings: Secondary | ICD-10-CM

## 2015-02-27 LAB — POCT GLYCOSYLATED HEMOGLOBIN (HGB A1C): Hemoglobin A1C: 5.5

## 2015-02-27 MED ORDER — PREDNISONE 20 MG PO TABS: 40.0000 mg | ORAL_TABLET | Freq: Every day | ORAL | Status: DC

## 2015-02-27 MED ORDER — ALBUTEROL SULFATE HFA 108 (90 BASE) MCG/ACT IN AERS: 1.0000 | INHALATION_SPRAY | Freq: Four times a day (QID) | RESPIRATORY_TRACT | Status: DC | PRN

## 2015-02-27 MED ORDER — ALBUTEROL SULFATE (2.5 MG/3ML) 0.083% IN NEBU: 2.5000 mg | INHALATION_SOLUTION | RESPIRATORY_TRACT | Status: DC | PRN

## 2015-02-27 NOTE — Progress Notes (Signed)
Patient's here for f/up lipoma to right lateral hip. Patient denies any pain. No further concerns.  Patient agreed to A1c screening and flu shot.  Patient requesting rx on prednisone, neb solution, inhaler .

## 2015-02-27 NOTE — Progress Notes (Signed)
   Subjective:    Patient ID: Stephen Miller, male    DOB: [birth year], 50 y.o.   MRN: [id number]  HPI Patient recently seen in ED, for subcutaneous mass which has been present for 10-15 years. Reports that it has grown in size over time; is unsightly and also prevents him from sleeping/laying on the R side.  Has never been evaluated by surgery for this.   Reports history bronchitis, with periodic flares. Has had to use albuterol in nebulizer up to 4x/day recently, for shortness of breath. Unsure of triggers, but smokes cigarettes (currently down to 3 cigarettes daily, from peak of 1ppd).  Denies fever or chills, no sputum production, no N/V/D.  Some cough with the dyspnea. No bloody sputum.  The albuterol nebs do provide relief. Last systemic/oral steroid was in 2015.  Social Hx Smokes cigarettes.   Family Hx: mother and sister with lipoma history.  Review of Systems No weight change, fevers or chills. Dyspnea and cough respond to albuterol.     Objective:   Physical Exam        Assessment & Plan:  1. Lipoma, R greater trochanter region. For General Surgery evaluation, given size.  Does not need any other medicines for pain at this time.   2. Bronchitis exacerbation, c/w asthma flare. Discussed smoking cessation. Short-term prednisone orally for seven days; albuterol refilled. Would likely benefit from inhaled steroid such as Qvar.  For follow up in 2-3 weeks and re-evaluation at that time.   Dalbert Mayotte, MD

## 2015-02-27 NOTE — Patient Instructions (Signed)
It was a pleasure to see you today.   For the breathing exacerbation, I am prescribing you PREDNISONE 20mg  tablets, take 2 tablets by mouth one time a day for seven days.   Albuterol, either by nebulizer OR the hand-held pump, every 4-6 hours as needed for shortness of breath.   I am glad you are quitting smoking!  Emergency Room or 9-1-1 if rapid worsening of shortness of breath.  I would like you to make a follow up visit here in 2 to 3 weeks to see how you are doing and consider long-acting medicines to control this.   For the lipoma on the Right hip, I am referring you to General Surgery.

## 2015-03-07 ENCOUNTER — Ambulatory Visit: Payer: Self-pay

## 2015-03-31 ENCOUNTER — Emergency Department (INDEPENDENT_AMBULATORY_CARE_PROVIDER_SITE_OTHER)
Admission: EM | Admit: 2015-03-31 | Discharge: 2015-03-31 | Disposition: A | Discharge status: Home / Self Care | Payer: Self-pay | Source: Home / Self Care | Attending: Family Medicine | Admitting: Family Medicine

## 2015-03-31 ENCOUNTER — Encounter (HOSPITAL_COMMUNITY): Payer: Self-pay | Admitting: Emergency Medicine

## 2015-03-31 DIAGNOSIS — J9801 Acute bronchospasm: Secondary | ICD-10-CM

## 2015-03-31 DIAGNOSIS — Z720 Tobacco use: Secondary | ICD-10-CM

## 2015-03-31 DIAGNOSIS — J410 Simple chronic bronchitis: Secondary | ICD-10-CM

## 2015-03-31 MED ORDER — ALBUTEROL SULFATE (2.5 MG/3ML) 0.083% IN NEBU
INHALATION_SOLUTION | RESPIRATORY_TRACT | Status: AC
  Filled 2015-03-31: qty 3

## 2015-03-31 MED ORDER — IPRATROPIUM-ALBUTEROL 0.5-2.5 (3) MG/3ML IN SOLN
RESPIRATORY_TRACT | Status: AC
  Filled 2015-03-31: qty 3

## 2015-03-31 MED ORDER — IPRATROPIUM-ALBUTEROL 0.5-2.5 (3) MG/3ML IN SOLN
3.0000 mL | Freq: Once | RESPIRATORY_TRACT | Status: AC
  Administered 2015-03-31: 3 mL via RESPIRATORY_TRACT

## 2015-03-31 MED ORDER — METHYLPREDNISOLONE ACETATE 80 MG/ML IJ SUSP
INTRAMUSCULAR | Status: AC
  Filled 2015-03-31: qty 1

## 2015-03-31 MED ORDER — METHYLPREDNISOLONE ACETATE 80 MG/ML IJ SUSP
80.0000 mg | Freq: Once | INTRAMUSCULAR | Status: AC
  Administered 2015-03-31: 80 mg via INTRAMUSCULAR

## 2015-03-31 MED ORDER — ALBUTEROL SULFATE (2.5 MG/3ML) 0.083% IN NEBU
2.5000 mg | INHALATION_SOLUTION | Freq: Once | RESPIRATORY_TRACT | Status: AC
  Administered 2015-03-31: 2.5 mg via RESPIRATORY_TRACT

## 2015-03-31 MED ORDER — ALBUTEROL SULFATE (2.5 MG/3ML) 0.083% IN NEBU: INHALATION_SOLUTION | RESPIRATORY_TRACT | Status: DC

## 2015-03-31 MED ORDER — PREDNISONE 20 MG PO TABS: ORAL_TABLET | ORAL | Status: DC

## 2015-03-31 NOTE — ED Notes (Signed)
Pt here with ongoing cough that started last week Wheezing, sob with yellow phlegm noted Using Ventolin inhaler without relief Denies chest pain or tightness

## 2015-03-31 NOTE — Discharge Instructions (Signed)
Bronchospasm, Adult A bronchospasm is a spasm or tightening of the airways going into the lungs. During a bronchospasm breathing becomes more difficult because the airways get smaller. When this happens there can be coughing, a whistling sound when breathing (wheezing), and difficulty breathing. Bronchospasm is often associated with asthma, but not all patients who experience a bronchospasm have asthma. CAUSES  A bronchospasm is caused by inflammation or irritation of the airways. The inflammation or irritation may be triggered by:   Allergies (such as to animals, pollen, food, or mold). Allergens that cause bronchospasm may cause wheezing immediately after exposure or many hours later.   Infection. Viral infections are believed to be the most common cause of bronchospasm.   Exercise.   Irritants (such as pollution, cigarette smoke, strong odors, aerosol sprays, and paint fumes).   Weather changes. Winds increase molds and pollens in the air. Rain refreshes the air by washing irritants out. Cold air may cause inflammation.   Stress and emotional upset.  SIGNS AND SYMPTOMS   Wheezing.   Excessive nighttime coughing.   Frequent or severe coughing with a simple cold.   Chest tightness.   Shortness of breath.  DIAGNOSIS  Bronchospasm is usually diagnosed through a history and physical exam. Tests, such as chest X-rays, are sometimes done to look for other conditions. TREATMENT   Inhaled medicines can be given to open up your airways and help you breathe. The medicines can be given using either an inhaler or a nebulizer machine.  Corticosteroid medicines may be given for severe bronchospasm, usually when it is associated with asthma. HOME CARE INSTRUCTIONS   Always have a plan prepared for seeking medical care. Know when to call your health care provider and local emergency services (911 in the [place] Know where you can access local emergency care.  Only take medicines as  directed by your health care provider.  If you were prescribed an inhaler or nebulizer machine, ask your health care provider to explain how to use it correctly. Always use a spacer with your inhaler if you were given one.  It is necessary to remain calm during an attack. Try to relax and breathe more slowly.  Control your home environment in the following ways:   Change your heating and air conditioning filter at least once a month.   Limit your use of fireplaces and wood stoves.  Do not smoke and do not allow smoking in your home.   Avoid exposure to perfumes and fragrances.   Get rid of pests (such as roaches and mice) and their droppings.   Throw away plants if you see mold on them.   Keep your house clean and dust free.   Replace carpet with wood, tile, or vinyl flooring. Carpet can trap dander and dust.   Use allergy-proof pillows, mattress covers, and box spring covers.   Wash bed sheets and blankets every week in hot water and dry them in a dryer.   Use blankets that are made of polyester or cotton.   Wash hands frequently. SEEK MEDICAL CARE IF:   You have muscle aches.   You have chest pain.   The sputum changes from clear or white to yellow, green, gray, or bloody.   The sputum you cough up gets thicker.   There are problems that may be related to the medicine you are given, such as a rash, itching, swelling, or trouble breathing.  SEEK IMMEDIATE MEDICAL CARE IF:   You have worsening wheezing and coughing  even after taking your prescribed medicines.   You have increased difficulty breathing.   You develop severe chest pain. MAKE SURE YOU:   Understand these instructions.  Will watch your condition.  Will get help right away if you are not doing well or get worse.   This information is not intended to replace advice given to you by your health care provider. Make sure you discuss any questions you have with your health care  provider.   Document Released: 12/31/2002 Document Revised: 01/18/2014 Document Reviewed: 06/19/2012 Elsevier Interactive Patient Education 2016 Else[employer] Inc[employer]  Chronic Bronchitis Chronic bronchitis is a lasting inflammation of the bronchial tubes, which are the tubes that carry air into your lungs. This is inflammation that occurs:   On most days of the week.   For at least three months at a time.   Over a period of two years in a row. When the bronchial tubes are inflamed, they start to produce mucus. The inflammation and buildup of mucus make it more difficult to breathe. Chronic bronchitis is usually a permanent problem and is one type of chronic obstructive pulmonary disease (COPD). People with chronic bronchitis are at greater risk for getting repeated colds, or respiratory infections. CAUSES  Chronic bronchitis most often occurs in people who have:  Long-standing, severe asthma.  A history of smoking.  Asthma and who also smoke. SIGNS AND SYMPTOMS  Chronic bronchitis may cause the following:   A cough that brings up mucus (productive cough).  Shortness of breath.  Early morning headache.  Wheezing.  Chest discomfort.   Recurring respiratory infections. DIAGNOSIS  Your health care provider may confirm the diagnosis by:  Taking your medical history.  Performing a physical exam.  Taking a chest X-ray.   Performing pulmonary function tests. TREATMENT  Treatment involves controlling symptoms with medicines, oxygen therapy, or making lifestyle changes, such as exercising and eating a healthy, well-balanced diet. Medicines could include:  Inhalers to improve air flow in and out of your lungs.  Antibiotics to treat bacterial infections, such as pneumonia, sinus infections, and acute bronchitis. As a preventative measure, your health care provider may recommend routine vaccinations for influenza and pneumonia. This is to prevent infection and hospitalization  since you may be more at risk for these types of infections.  HOME CARE INSTRUCTIONS  Take medicines only as directed by your health care provider.   If you smoke cigarettes, chew tobacco, or use electronic cigarettes, quit. If you need help quitting, ask your health care provider.  Avoid pollen, dust, animal dander, molds, smoke, and other things that cause shortness of breath or wheezing attacks.  Talk to your health care provider about possible exercise routines. Regular exercise is very important to help you feel better.  If you are prescribed oxygen use at home follow these guidelines:  Never smoke while using oxygen. Oxygen does not burn or explode, but flammable materials will burn faster in the presence of oxygen.  Keep a Data processing manager close by. Let your fire department know that you have oxygen in your home.  Warn visitors not to smoke near you when you are using oxygen. Put up "no smoking" signs in your home where you most often use the oxygen.  Regularly test your smoke detectors at home to make sure they work. If you receive care in your home from a nurse or other health care provider, he or she may also check to make sure your smoke detectors work.  Ask your health care  provider whether you would benefit from a pulmonary rehabilitation program.  Do not wait to get medical care if you have any concerning symptoms. Delays could cause permanent injury and may be life threatening. SEEK MEDICAL CARE IF:  You have increased coughing or shortness of breath or both.  You have muscle aches.  You have chest pain.  Your mucus gets thicker.  Your mucus changes from clear or white to yellow, green, gray, or bloody. SEEK IMMEDIATE MEDICAL CARE IF:  Your usual medicines do not stop your wheezing.   You have increased difficulty breathing.   You have any problems with the medicine you are taking, such as a rash, itching, swelling, or trouble breathing. MAKE SURE YOU:     Understand these instructions.  Will watch your condition.  Will get help right away if you are not doing well or get worse.   This information is not intended to replace advice given to you by your health care provider. Make sure you discuss any questions you have with your health care provider.   Document Released: 10/15/2005 Document Revised: 01/18/2014 Document Reviewed: 02/05/2013 Elsevier Interactive Patient Education 2016 Elsevier Inc.  Cough, Adult Coughing is a reflex that clears your throat and your airways. Coughing helps to heal and protect your lungs. It is normal to cough occasionally, but a cough that happens with other symptoms or lasts a long time may be a sign of a condition that needs treatment. A cough may last only 2-3 weeks (acute), or it may last longer than 8 weeks (chronic). CAUSES Coughing is commonly caused by:  Breathing in substances that irritate your lungs.  A viral or bacterial respiratory infection.  Allergies.  Asthma.  Postnasal drip.  Smoking.  Acid backing up from the stomach into the esophagus (gastroesophageal reflux).  Certain medicines.  Chronic lung problems, including COPD (or rarely, lung cancer).  Other medical conditions such as heart failure. HOME CARE INSTRUCTIONS  Pay attention to any changes in your symptoms. Take these actions to help with your discomfort:  Take medicines only as told by your health care provider.  If you were prescribed an antibiotic medicine, take it as told by your health care provider. Do not stop taking the antibiotic even if you start to feel better.  Talk with your health care provider before you take a cough suppressant medicine.  Drink enough fluid to keep your urine clear or pale yellow.  If the air is dry, use a cold steam vaporizer or humidifier in your bedroom or your home to help loosen secretions.  Avoid anything that causes you to cough at work or at home.  If your cough is worse  at night, try sleeping in a semi-upright position.  Avoid cigarette smoke. If you smoke, quit smoking. If you need help quitting, ask your health care provider.  Avoid caffeine.  Avoid alcohol.  Rest as needed. SEEK MEDICAL CARE IF:   You have new symptoms.  You cough up pus.  Your cough does not get better after 2-3 weeks, or your cough gets worse.  You cannot control your cough with suppressant medicines and you are losing sleep.  You develop pain that is getting worse or pain that is not controlled with pain medicines.  You have a fever.  You have unexplained weight loss.  You have night sweats. SEEK IMMEDIATE MEDICAL CARE IF:  You cough up blood.  You have difficulty breathing.  Your heartbeat is very fast.   This information is  not intended to replace advice given to you by your health care provider. Make sure you discuss any questions you have with your health care provider.   Document Released: 06/26/2010 Document Revised: 09/18/2014 Document Reviewed: 03/06/2014 Elsevier Interactive Patient Education 2016 [employer].  Smoking Cessation, Tips for Success If you are ready to quit smoking, congratulations! You have chosen to help yourself be healthier. Cigarettes bring nicotine, tar, carbon monoxide, and other irritants into your body. Your lungs, heart, and blood vessels will be able to work better without these poisons. There are many different ways to quit smoking. Nicotine gum, nicotine patches, a nicotine inhaler, or nicotine nasal spray can help with physical craving. Hypnosis, support groups, and medicines help break the habit of smoking. WHAT THINGS CAN I DO TO MAKE QUITTING EASIER?  Here are some tips to help you quit for good:  Pick a date when you will quit smoking completely. Tell all of your friends and family about your plan to quit on that date.  Do not try to slowly cut down on the number of cigarettes you are smoking. Pick a quit date and quit  smoking completely starting on that day.  Throw away all cigarettes.   Clean and remove all ashtrays from your home, work, and car.  On a card, write down your reasons for quitting. Carry the card with you and read it when you get the urge to smoke.  Cleanse your body of nicotine. Drink enough water and fluids to keep your urine clear or pale yellow. Do this after quitting to flush the nicotine from your body.  Learn to predict your moods. Do not let a bad situation be your excuse to have a cigarette. Some situations in your life might tempt you into wanting a cigarette.  Never have "just one" cigarette. It leads to wanting another and another. Remind yourself of your decision to quit.  Change habits associated with smoking. If you smoked while driving or when feeling stressed, try other activities to replace smoking. Stand up when drinking your coffee. Brush your teeth after eating. Sit in a different chair when you read the paper. Avoid alcohol while trying to quit, and try to drink fewer caffeinated beverages. Alcohol and caffeine may urge you to smoke.  Avoid foods and drinks that can trigger a desire to smoke, such as sugary or spicy foods and alcohol.  Ask people who smoke not to smoke around you.  Have something planned to do right after eating or having a cup of coffee. For example, plan to take a walk or exercise.  Try a relaxation exercise to calm you down and decrease your stress. Remember, you may be tense and nervous for the first 2 weeks after you quit, but this will pass.  Find new activities to keep your hands busy. Play with a pen, coin, or rubber band. Doodle or draw things on paper.  Brush your teeth right after eating. This will help cut down on the craving for the taste of tobacco after meals. You can also try mouthwash.   Use oral substitutes in place of cigarettes. Try using lemon drops, carrots, cinnamon sticks, or chewing gum. Keep them handy so they are  available when you have the urge to smoke.  When you have the urge to smoke, try deep breathing.  Designate your home as a nonsmoking area.  If you are a heavy smoker, ask your health care provider about a prescription for nicotine chewing gum. It can ease  your withdrawal from nicotine.  Reward yourself. Set aside the cigarette money you save and buy yourself something nice.  Look for support from others. Join a support group or smoking cessation program. Ask someone at home or at work to help you with your plan to quit smoking.  Always ask yourself, "Do I need this cigarette or is this just a reflex?" Tell yourself, "Today, I choose not to smoke," or "I do not want to smoke." You are reminding yourself of your decision to quit.  Do not replace cigarette smoking with electronic cigarettes (commonly called e-cigarettes). The safety of e-cigarettes is unknown, and some may contain harmful chemicals.  If you relapse, do not give up! Plan ahead and think about what you will do the next time you get the urge to smoke. HOW WILL I FEEL WHEN I QUIT SMOKING? You may have symptoms of withdrawal because your body is used to nicotine (the addictive substance in cigarettes). You may crave cigarettes, be irritable, feel very hungry, cough often, get headaches, or have difficulty concentrating. The withdrawal symptoms are only temporary. They are strongest when you first quit but will go away within 10-14 days. When withdrawal symptoms occur, stay in control. Think about your reasons for quitting. Remind yourself that these are signs that your body is healing and getting used to being without cigarettes. Remember that withdrawal symptoms are easier to treat than the major diseases that smoking can cause.  Even after the withdrawal is over, expect periodic urges to smoke. However, these cravings are generally short lived and will go away whether you smoke or not. Do not smoke! WHAT RESOURCES ARE AVAILABLE TO  HELP ME QUIT SMOKING? Your health care provider can direct you to community resources or hospitals for support, which may include:  Group support.  Education.  Hypnosis.  Therapy.   This information is not intended to replace advice given to you by your health care provider. Make sure you discuss any questions you have with your health care provider.   Document Released: 09/26/2003 Document Revised: 01/18/2014 Document Reviewed: 06/15/2012 Elsevier Interactive Patient Education [employer].
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
## 2015-09-16 ENCOUNTER — Ambulatory Visit (INDEPENDENT_AMBULATORY_CARE_PROVIDER_SITE_OTHER): Payer: Self-pay

## 2015-09-16 ENCOUNTER — Ambulatory Visit (HOSPITAL_COMMUNITY)
Admission: EM | Admit: 2015-09-16 | Discharge: 2015-09-16 | Disposition: A | Discharge status: Home / Self Care | Payer: Self-pay | Attending: Family Medicine | Admitting: Family Medicine

## 2015-09-16 ENCOUNTER — Encounter (HOSPITAL_COMMUNITY): Payer: Self-pay | Admitting: Family Medicine

## 2015-09-16 DIAGNOSIS — J4521 Mild intermittent asthma with (acute) exacerbation: Secondary | ICD-10-CM

## 2015-09-16 DIAGNOSIS — M79642 Pain in left hand: Secondary | ICD-10-CM

## 2015-09-16 MED ORDER — IPRATROPIUM-ALBUTEROL 0.5-2.5 (3) MG/3ML IN SOLN
3.0000 mL | Freq: Once | RESPIRATORY_TRACT | Status: AC
  Administered 2015-09-16: 3 mL via RESPIRATORY_TRACT

## 2015-09-16 MED ORDER — PREDNISONE 20 MG PO TABS: ORAL_TABLET | ORAL | 0 refills | Status: DC

## 2015-09-16 MED ORDER — IPRATROPIUM-ALBUTEROL 0.5-2.5 (3) MG/3ML IN SOLN
RESPIRATORY_TRACT | Status: AC
  Filled 2015-09-16: qty 3

## 2015-09-16 MED ORDER — METHYLPREDNISOLONE ACETATE 80 MG/ML IJ SUSP
INTRAMUSCULAR | Status: AC
  Filled 2015-09-16: qty 1

## 2015-09-16 MED ORDER — ALBUTEROL SULFATE HFA 108 (90 BASE) MCG/ACT IN AERS: 2.0000 | INHALATION_SPRAY | RESPIRATORY_TRACT | 11 refills | Status: DC | PRN

## 2015-09-16 MED ORDER — METHYLPREDNISOLONE ACETATE 80 MG/ML IJ SUSP
80.0000 mg | Freq: Once | INTRAMUSCULAR | Status: AC
  Administered 2015-09-16: 80 mg via INTRAMUSCULAR
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
## 2015-09-16 NOTE — Discharge Instructions (Signed)
Return if the asthma is not well controlled after 3 days

## 2015-09-16 NOTE — ED Triage Notes (Signed)
Pt here for pain and swelling to left hand. sts he hit it playing with his dog last week . sts also that he has been coughing and wheezing.

## 2015-10-30 ENCOUNTER — Emergency Department
Admission: EM | Admit: 2015-10-30 | Discharge: 2015-10-30 | Disposition: A | Discharge status: Home / Self Care | Payer: Self-pay | Attending: Emergency Medicine | Admitting: Emergency Medicine

## 2015-10-30 ENCOUNTER — Encounter: Payer: Self-pay | Admitting: *Deleted

## 2015-10-30 ENCOUNTER — Emergency Department: Payer: Self-pay

## 2015-10-30 DIAGNOSIS — F17210 Nicotine dependence, cigarettes, uncomplicated: Secondary | ICD-10-CM | POA: Insufficient documentation

## 2015-10-30 DIAGNOSIS — Y929 Unspecified place or not applicable: Secondary | ICD-10-CM | POA: Insufficient documentation

## 2015-10-30 DIAGNOSIS — S61412A Laceration without foreign body of left hand, initial encounter: Secondary | ICD-10-CM | POA: Insufficient documentation

## 2015-10-30 DIAGNOSIS — Z23 Encounter for immunization: Secondary | ICD-10-CM | POA: Insufficient documentation

## 2015-10-30 DIAGNOSIS — W260XXA Contact with knife, initial encounter: Secondary | ICD-10-CM | POA: Insufficient documentation

## 2015-10-30 DIAGNOSIS — J441 Chronic obstructive pulmonary disease with (acute) exacerbation: Secondary | ICD-10-CM | POA: Insufficient documentation

## 2015-10-30 DIAGNOSIS — Y9389 Activity, other specified: Secondary | ICD-10-CM | POA: Insufficient documentation

## 2015-10-30 DIAGNOSIS — Y999 Unspecified external cause status: Secondary | ICD-10-CM | POA: Insufficient documentation

## 2015-10-30 DIAGNOSIS — J45909 Unspecified asthma, uncomplicated: Secondary | ICD-10-CM | POA: Insufficient documentation

## 2015-10-30 MED ORDER — CEPHALEXIN 500 MG PO CAPS: 500.0000 mg | ORAL_CAPSULE | Freq: Three times a day (TID) | ORAL | 0 refills | Status: DC

## 2015-10-30 MED ORDER — LIDOCAINE HCL (PF) 1 % IJ SOLN
5.0000 mL | Freq: Once | INTRAMUSCULAR | Status: AC
  Administered 2015-10-30: 5 mL
  Filled 2015-10-30: qty 5

## 2015-10-30 MED ORDER — TETANUS-DIPHTH-ACELL PERTUSSIS 5-2.5-18.5 LF-MCG/0.5 IM SUSP
0.5000 mL | Freq: Once | INTRAMUSCULAR | Status: AC
  Administered 2015-10-30: 0.5 mL via INTRAMUSCULAR
  Filled 2015-10-30: qty 0.5

## 2015-10-30 NOTE — ED Triage Notes (Signed)
Pt states he cut his left hand with his pocket knife while fishing, states last tetnus unknown, bleeding controlled
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
## 2015-11-24 ENCOUNTER — Emergency Department (HOSPITAL_BASED_OUTPATIENT_CLINIC_OR_DEPARTMENT_OTHER)
Admission: EM | Admit: 2015-11-24 | Discharge: 2015-11-25 | Disposition: A | Discharge status: Home / Self Care | Payer: Self-pay | Attending: Emergency Medicine | Admitting: Emergency Medicine

## 2015-11-24 ENCOUNTER — Emergency Department (HOSPITAL_BASED_OUTPATIENT_CLINIC_OR_DEPARTMENT_OTHER): Payer: Self-pay

## 2015-11-24 ENCOUNTER — Encounter (HOSPITAL_BASED_OUTPATIENT_CLINIC_OR_DEPARTMENT_OTHER): Payer: Self-pay | Admitting: *Deleted

## 2015-11-24 DIAGNOSIS — T782XXA Anaphylactic shock, unspecified, initial encounter: Secondary | ICD-10-CM | POA: Insufficient documentation

## 2015-11-24 DIAGNOSIS — J449 Chronic obstructive pulmonary disease, unspecified: Secondary | ICD-10-CM | POA: Insufficient documentation

## 2015-11-24 DIAGNOSIS — K2900 Acute gastritis without bleeding: Secondary | ICD-10-CM | POA: Insufficient documentation

## 2015-11-24 DIAGNOSIS — F17210 Nicotine dependence, cigarettes, uncomplicated: Secondary | ICD-10-CM | POA: Insufficient documentation

## 2015-11-24 DIAGNOSIS — J45909 Unspecified asthma, uncomplicated: Secondary | ICD-10-CM | POA: Insufficient documentation

## 2015-11-24 HISTORY — DX: Accidental discharge from unspecified firearms or gun, initial encounter: W34.00XA

## 2015-11-24 HISTORY — DX: Unspecified firearm discharge, undetermined intent, initial encounter: Y24.9XXA

## 2015-11-24 LAB — COMPREHENSIVE METABOLIC PANEL
ALT: 22 U/L (ref 17–63)
ANION GAP: 7 (ref 5–15)
AST: 24 U/L (ref 15–41)
Albumin: 4.1 g/dL (ref 3.5–5.0)
Alkaline Phosphatase: 51 U/L (ref 38–126)
BILIRUBIN TOTAL: 0.4 mg/dL (ref 0.3–1.2)
BUN: 13 mg/dL (ref 6–20)
CO2: 23 mmol/L (ref 22–32)
Calcium: 8.8 mg/dL — ABNORMAL LOW (ref 8.9–10.3)
Chloride: 107 mmol/L (ref 101–111)
Creatinine, Ser: 1.19 mg/dL (ref 0.61–1.24)
GFR calc Af Amer: >60 mL/min (ref >60)
Glucose, Bld: 121 mg/dL — ABNORMAL HIGH (ref 65–99)
POTASSIUM: 4.2 mmol/L (ref 3.5–5.1)
Sodium: 137 mmol/L (ref 135–145)
TOTAL PROTEIN: 7 g/dL (ref 6.5–8.1)

## 2015-11-24 LAB — URINALYSIS, ROUTINE W REFLEX MICROSCOPIC
Bilirubin Urine: NEGATIVE
Glucose, UA: NEGATIVE mg/dL
Hgb urine dipstick: NEGATIVE
KETONES UR: NEGATIVE mg/dL
LEUKOCYTES UA: NEGATIVE
NITRITE: NEGATIVE
PH: 6 (ref 5.0–8.0)
PROTEIN: NEGATIVE mg/dL
Specific Gravity, Urine: 1.024 (ref 1.005–1.030)

## 2015-11-24 LAB — CBC
HEMATOCRIT: 39.7 % (ref 39.0–52.0)
Hemoglobin: 13.6 g/dL (ref 13.0–17.0)
MCH: 31.1 pg (ref 26.0–34.0)
MCHC: 34.3 g/dL (ref 30.0–36.0)
MCV: 90.6 fL (ref 78.0–100.0)
Platelets: 211 10*3/uL (ref 150–400)
RBC: 4.38 MIL/uL (ref 4.22–5.81)
RDW: 13.3 % (ref 11.5–15.5)
WBC: 6 10*3/uL (ref 4.0–10.5)

## 2015-11-24 LAB — LIPASE, BLOOD: Lipase: 25 U/L (ref 11–51)

## 2015-11-24 MED ORDER — EPINEPHRINE 0.3 MG/0.3ML IJ SOAJ
INTRAMUSCULAR | Status: AC
  Filled 2015-11-24: qty 0.3

## 2015-11-24 MED ORDER — DIPHENHYDRAMINE HCL 50 MG/ML IJ SOLN
INTRAMUSCULAR | Status: AC
  Filled 2015-11-24: qty 1

## 2015-11-24 MED ORDER — ALBUTEROL SULFATE HFA 108 (90 BASE) MCG/ACT IN AERS
1.0000 | INHALATION_SPRAY | RESPIRATORY_TRACT | Status: DC | PRN
  Administered 2015-11-24: 2 via RESPIRATORY_TRACT
  Filled 2015-11-24: qty 6.7

## 2015-11-24 MED ORDER — METHYLPREDNISOLONE SODIUM SUCC 125 MG IJ SOLR
INTRAMUSCULAR | Status: AC
  Filled 2015-11-24: qty 2

## 2015-11-24 MED ORDER — SUCRALFATE 1 G PO TABS: 1.0000 g | ORAL_TABLET | Freq: Three times a day (TID) | ORAL | 0 refills | Status: DC

## 2015-11-24 MED ORDER — METHYLPREDNISOLONE SODIUM SUCC 125 MG IJ SOLR
125.0000 mg | Freq: Once | INTRAMUSCULAR | Status: AC
  Administered 2015-11-24: 125 mg via INTRAVENOUS

## 2015-11-24 MED ORDER — EPINEPHRINE 0.3 MG/0.3ML IJ SOAJ
0.3000 mg | Freq: Once | INTRAMUSCULAR | Status: AC
  Administered 2015-11-24: 0.3 mg via INTRAMUSCULAR

## 2015-11-24 MED ORDER — SODIUM CHLORIDE 0.9 % IV BOLUS (SEPSIS)
1000.0000 mL | Freq: Once | INTRAVENOUS | Status: AC
  Administered 2015-11-24: 1000 mL via INTRAVENOUS

## 2015-11-24 MED ORDER — IOPAMIDOL (ISOVUE-300) INJECTION 61%
100.0000 mL | Freq: Once | INTRAVENOUS | Status: AC | PRN
  Administered 2015-11-24: 100 mL via INTRAVENOUS

## 2015-11-24 MED ORDER — DIPHENHYDRAMINE HCL 50 MG/ML IJ SOLN
25.0000 mg | Freq: Once | INTRAMUSCULAR | Status: AC
  Administered 2015-11-24: 25 mg via INTRAVENOUS

## 2015-11-24 MED ORDER — FAMOTIDINE IN NACL 20-0.9 MG/50ML-% IV SOLN
20.0000 mg | Freq: Two times a day (BID) | INTRAVENOUS | Status: DC
  Administered 2015-11-24: 20 mg via INTRAVENOUS
  Filled 2015-11-24: qty 50

## 2015-11-24 MED ORDER — FAMOTIDINE 20 MG PO TABS: 20.0000 mg | ORAL_TABLET | Freq: Two times a day (BID) | ORAL | 0 refills | Status: DC

## 2015-11-24 NOTE — ED Notes (Signed)
Patient transported to CT 
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
## 2015-11-24 NOTE — ED Triage Notes (Signed)
Pt c/o upper abd pain x 1 month " goes and comes"

## 2015-11-24 NOTE — ED Notes (Signed)
Pt states he is feeling a lot better
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
## 2015-11-24 NOTE — ED Notes (Signed)
Pt escorted back from CT, tech states that he is having a reaction to the contrast, he vomited right away and stated that his throat felt like it is swelling.  Dr. Tamera Punt made aware.

## 2015-12-22 ENCOUNTER — Encounter (HOSPITAL_COMMUNITY): Payer: Self-pay | Admitting: Emergency Medicine

## 2015-12-22 ENCOUNTER — Ambulatory Visit (HOSPITAL_COMMUNITY)
Admission: EM | Admit: 2015-12-22 | Discharge: 2015-12-22 | Disposition: A | Discharge status: Home / Self Care | Payer: Self-pay | Attending: Family Medicine | Admitting: Family Medicine

## 2015-12-22 DIAGNOSIS — J40 Bronchitis, not specified as acute or chronic: Secondary | ICD-10-CM

## 2015-12-22 MED ORDER — PREDNISONE 20 MG PO TABS: ORAL_TABLET | ORAL | 0 refills | Status: DC

## 2015-12-22 MED ORDER — ALBUTEROL SULFATE (2.5 MG/3ML) 0.083% IN NEBU: 2.5000 mg | INHALATION_SOLUTION | Freq: Four times a day (QID) | RESPIRATORY_TRACT | 12 refills | Status: DC | PRN

## 2015-12-22 MED ORDER — HYDROCODONE-HOMATROPINE 5-1.5 MG/5ML PO SYRP: 5.0000 mL | ORAL_SOLUTION | Freq: Four times a day (QID) | ORAL | 0 refills | Status: DC | PRN

## 2015-12-22 NOTE — ED Triage Notes (Signed)
The patient presented to the [facility] with a complaint of a cough and congestion x 3 days. 
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
## 2016-01-07 ENCOUNTER — Encounter (HOSPITAL_COMMUNITY): Payer: Self-pay | Admitting: Emergency Medicine

## 2016-01-07 ENCOUNTER — Emergency Department (HOSPITAL_COMMUNITY)
Admission: EM | Admit: 2016-01-07 | Discharge: 2016-01-07 | Disposition: A | Discharge status: Home / Self Care | Payer: Self-pay | Attending: Emergency Medicine | Admitting: Emergency Medicine

## 2016-01-07 ENCOUNTER — Emergency Department (HOSPITAL_COMMUNITY): Payer: Self-pay

## 2016-01-07 DIAGNOSIS — R0789 Other chest pain: Secondary | ICD-10-CM | POA: Insufficient documentation

## 2016-01-07 DIAGNOSIS — J449 Chronic obstructive pulmonary disease, unspecified: Secondary | ICD-10-CM | POA: Insufficient documentation

## 2016-01-07 DIAGNOSIS — Z79899 Other long term (current) drug therapy: Secondary | ICD-10-CM | POA: Insufficient documentation

## 2016-01-07 DIAGNOSIS — F17210 Nicotine dependence, cigarettes, uncomplicated: Secondary | ICD-10-CM | POA: Insufficient documentation

## 2016-01-07 DIAGNOSIS — J40 Bronchitis, not specified as acute or chronic: Secondary | ICD-10-CM | POA: Insufficient documentation

## 2016-01-07 LAB — BASIC METABOLIC PANEL
Anion gap: 9 (ref 5–15)
BUN: 12 mg/dL (ref 6–20)
CHLORIDE: 102 mmol/L (ref 101–111)
CO2: 25 mmol/L (ref 22–32)
Calcium: 9.1 mg/dL (ref 8.9–10.3)
Creatinine, Ser: 1.38 mg/dL — ABNORMAL HIGH (ref 0.61–1.24)
GFR calc Af Amer: >60 mL/min (ref >60)
GFR calc non Af Amer: 59 mL/min — ABNORMAL LOW (ref >60)
GLUCOSE: 121 mg/dL — AB (ref 65–99)
POTASSIUM: 4.4 mmol/L (ref 3.5–5.1)
Sodium: 136 mmol/L (ref 135–145)

## 2016-01-07 LAB — CBC
HEMATOCRIT: 42 % (ref 39.0–52.0)
Hemoglobin: 14.5 g/dL (ref 13.0–17.0)
MCH: 31.3 pg (ref 26.0–34.0)
MCHC: 34.5 g/dL (ref 30.0–36.0)
MCV: 90.7 fL (ref 78.0–100.0)
Platelets: 241 10*3/uL (ref 150–400)
RBC: 4.63 MIL/uL (ref 4.22–5.81)
RDW: 13.4 % (ref 11.5–15.5)
WBC: 7.8 10*3/uL (ref 4.0–10.5)

## 2016-01-07 LAB — I-STAT TROPONIN, ED
TROPONIN I, POC: 0.01 ng/mL (ref 0.00–0.08)
Troponin i, poc: 0 ng/mL (ref 0.00–0.08)

## 2016-01-07 MED ORDER — PREDNISONE 20 MG PO TABS: 40.0000 mg | ORAL_TABLET | Freq: Every day | ORAL | 0 refills | Status: DC
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
## 2016-01-07 NOTE — ED Triage Notes (Signed)
Pt. Stated, I started having chest pain about 25 min. Ago , denies any other symptoms.

## 2016-01-28 ENCOUNTER — Emergency Department (HOSPITAL_COMMUNITY)
Admission: EM | Admit: 2016-01-28 | Discharge: 2016-01-28 | Disposition: A | Discharge status: Home / Self Care | Payer: Self-pay | Attending: Emergency Medicine | Admitting: Emergency Medicine

## 2016-01-28 ENCOUNTER — Encounter (HOSPITAL_COMMUNITY): Payer: Self-pay | Admitting: *Deleted

## 2016-01-28 DIAGNOSIS — Y9389 Activity, other specified: Secondary | ICD-10-CM | POA: Insufficient documentation

## 2016-01-28 DIAGNOSIS — J449 Chronic obstructive pulmonary disease, unspecified: Secondary | ICD-10-CM | POA: Insufficient documentation

## 2016-01-28 DIAGNOSIS — Y929 Unspecified place or not applicable: Secondary | ICD-10-CM | POA: Insufficient documentation

## 2016-01-28 DIAGNOSIS — F17210 Nicotine dependence, cigarettes, uncomplicated: Secondary | ICD-10-CM | POA: Insufficient documentation

## 2016-01-28 DIAGNOSIS — S61211A Laceration without foreign body of left index finger without damage to nail, initial encounter: Secondary | ICD-10-CM | POA: Insufficient documentation

## 2016-01-28 DIAGNOSIS — Y999 Unspecified external cause status: Secondary | ICD-10-CM | POA: Insufficient documentation

## 2016-01-28 DIAGNOSIS — Z79899 Other long term (current) drug therapy: Secondary | ICD-10-CM | POA: Insufficient documentation

## 2016-01-28 DIAGNOSIS — W268XXA Contact with other sharp object(s), not elsewhere classified, initial encounter: Secondary | ICD-10-CM | POA: Insufficient documentation

## 2016-01-28 MED ORDER — BACITRACIN ZINC 500 UNIT/GM EX OINT: 1.0000 "application " | TOPICAL_OINTMENT | Freq: Two times a day (BID) | CUTANEOUS | Status: DC

## 2016-01-28 NOTE — ED Triage Notes (Signed)
States he cut his left index finger last pm .
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
## 2016-03-22 ENCOUNTER — Ambulatory Visit (INDEPENDENT_AMBULATORY_CARE_PROVIDER_SITE_OTHER): Payer: Self-pay

## 2016-03-22 ENCOUNTER — Encounter (HOSPITAL_COMMUNITY): Payer: Self-pay | Admitting: *Deleted

## 2016-03-22 ENCOUNTER — Ambulatory Visit (HOSPITAL_COMMUNITY)
Admission: EM | Admit: 2016-03-22 | Discharge: 2016-03-22 | Disposition: A | Discharge status: Home / Self Care | Payer: Self-pay | Attending: Emergency Medicine | Admitting: Emergency Medicine

## 2016-03-22 DIAGNOSIS — J40 Bronchitis, not specified as acute or chronic: Secondary | ICD-10-CM

## 2016-03-22 DIAGNOSIS — J4541 Moderate persistent asthma with (acute) exacerbation: Secondary | ICD-10-CM

## 2016-03-22 DIAGNOSIS — J45901 Unspecified asthma with (acute) exacerbation: Secondary | ICD-10-CM

## 2016-03-22 MED ORDER — SODIUM CHLORIDE 0.9 % IN NEBU
INHALATION_SOLUTION | RESPIRATORY_TRACT | Status: AC
  Filled 2016-03-22: qty 3

## 2016-03-22 MED ORDER — IPRATROPIUM-ALBUTEROL 0.5-2.5 (3) MG/3ML IN SOLN
RESPIRATORY_TRACT | Status: AC
  Filled 2016-03-22: qty 3

## 2016-03-22 MED ORDER — PREDNISONE 50 MG PO TABS: ORAL_TABLET | ORAL | 0 refills | Status: DC

## 2016-03-22 MED ORDER — IPRATROPIUM-ALBUTEROL 0.5-2.5 (3) MG/3ML IN SOLN
3.0000 mL | Freq: Once | RESPIRATORY_TRACT | Status: AC
  Administered 2016-03-22: 3 mL via RESPIRATORY_TRACT

## 2016-03-22 MED ORDER — AZITHROMYCIN 250 MG PO TABS: 250.0000 mg | ORAL_TABLET | Freq: Every day | ORAL | 0 refills | Status: DC

## 2016-03-22 MED ORDER — IPRATROPIUM BROMIDE 0.02 % IN SOLN: 0.5000 mg | Freq: Four times a day (QID) | RESPIRATORY_TRACT | 12 refills | Status: DC

## 2016-03-22 MED ORDER — DEXAMETHASONE SODIUM PHOSPHATE 10 MG/ML IJ SOLN
10.0000 mg | Freq: Once | INTRAMUSCULAR | Status: AC
  Administered 2016-03-22: 10 mg via INTRAMUSCULAR

## 2016-03-22 MED ORDER — HYDROCODONE-HOMATROPINE 5-1.5 MG/5ML PO SYRP: 5.0000 mL | ORAL_SOLUTION | Freq: Four times a day (QID) | ORAL | 0 refills | Status: DC | PRN

## 2016-03-22 MED ORDER — DEXAMETHASONE SODIUM PHOSPHATE 10 MG/ML IJ SOLN
INTRAMUSCULAR | Status: AC
  Filled 2016-03-22: qty 1

## 2016-03-22 MED ORDER — ALBUTEROL SULFATE (2.5 MG/3ML) 0.083% IN NEBU: 2.5000 mg | INHALATION_SOLUTION | Freq: Four times a day (QID) | RESPIRATORY_TRACT | 12 refills | Status: DC | PRN

## 2016-03-22 NOTE — Discharge Instructions (Signed)
I have refilled your albuterol. I have also prescribed an additional medicine called Atrovent, you may mix this medication with your albuterol and used them both together. I prescribed azithromycin, take 2 tablets today and then one daily until finished, and prednisone, take one tablet daily with food. For cough, prescribed Hycodan, take 5 mL every 6 hours as needed for cough. This medicine is a narcotic, it will cause drowsiness, and it is addictive. Do not take more than what is necessary, do not drink alcohol while taking, and do not operate any heavy machinery while taking this medicine.

## 2016-03-22 NOTE — ED Notes (Signed)
Attempted to get patient for x-ray, provider wants patient to receive breathing treatment prior to x-ray.
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
## 2016-03-22 NOTE — ED Triage Notes (Signed)
Cough   Wheezing  X   4  Days     Pt  Reports   He  Stopped  Smoking   3  Weeks  Ago  Pt  Has  A  History  Of  Bronchitis   Pt  Is  Wheezing         And   Gets  Short  Of  Breath  On  Exertion

## 2016-04-20 ENCOUNTER — Ambulatory Visit (HOSPITAL_COMMUNITY)
Admission: EM | Admit: 2016-04-20 | Discharge: 2016-04-20 | Disposition: A | Discharge status: Home / Self Care | Payer: Self-pay | Attending: Internal Medicine | Admitting: Internal Medicine

## 2016-04-20 ENCOUNTER — Encounter (HOSPITAL_COMMUNITY): Payer: Self-pay | Admitting: Emergency Medicine

## 2016-04-20 DIAGNOSIS — R1013 Epigastric pain: Secondary | ICD-10-CM

## 2016-04-20 MED ORDER — GI COCKTAIL ~~LOC~~
ORAL | Status: AC
  Filled 2016-04-20: qty 30

## 2016-04-20 MED ORDER — PANTOPRAZOLE SODIUM 40 MG PO TBEC: 40.0000 mg | DELAYED_RELEASE_TABLET | Freq: Every day | ORAL | 1 refills | Status: DC

## 2016-04-20 MED ORDER — GI COCKTAIL ~~LOC~~
30.0000 mL | Freq: Once | ORAL | Status: AC
  Administered 2016-04-20: 30 mL via ORAL

## 2016-04-20 NOTE — ED Triage Notes (Signed)
The patient presented to the [facility] with off and on upper abdominal pain x 3 weeks.
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
## 2016-05-10 ENCOUNTER — Encounter (HOSPITAL_COMMUNITY): Payer: Self-pay | Admitting: Emergency Medicine

## 2016-05-10 ENCOUNTER — Ambulatory Visit (HOSPITAL_COMMUNITY)
Admission: EM | Admit: 2016-05-10 | Discharge: 2016-05-10 | Disposition: A | Discharge status: Home / Self Care | Payer: Self-pay | Attending: Internal Medicine | Admitting: Internal Medicine

## 2016-05-10 DIAGNOSIS — J449 Chronic obstructive pulmonary disease, unspecified: Secondary | ICD-10-CM

## 2016-05-10 DIAGNOSIS — F17210 Nicotine dependence, cigarettes, uncomplicated: Secondary | ICD-10-CM

## 2016-05-10 DIAGNOSIS — J42 Unspecified chronic bronchitis: Secondary | ICD-10-CM

## 2016-05-10 DIAGNOSIS — J45909 Unspecified asthma, uncomplicated: Secondary | ICD-10-CM

## 2016-05-10 DIAGNOSIS — J301 Allergic rhinitis due to pollen: Secondary | ICD-10-CM

## 2016-05-10 DIAGNOSIS — J209 Acute bronchitis, unspecified: Secondary | ICD-10-CM

## 2016-05-10 MED ORDER — PREDNISONE 50 MG PO TABS: ORAL_TABLET | ORAL | 0 refills | Status: DC

## 2016-05-10 MED ORDER — TRIAMCINOLONE ACETONIDE 40 MG/ML IJ SUSP
INTRAMUSCULAR | Status: AC
  Filled 2016-05-10: qty 1

## 2016-05-10 MED ORDER — IPRATROPIUM-ALBUTEROL 0.5-2.5 (3) MG/3ML IN SOLN
3.0000 mL | Freq: Once | RESPIRATORY_TRACT | Status: AC
  Administered 2016-05-10: 3 mL via RESPIRATORY_TRACT

## 2016-05-10 MED ORDER — TRIAMCINOLONE ACETONIDE 40 MG/ML IJ SUSP
40.0000 mg | Freq: Once | INTRAMUSCULAR | Status: AC
  Administered 2016-05-10: 40 mg via INTRAMUSCULAR

## 2016-05-10 MED ORDER — IPRATROPIUM-ALBUTEROL 0.5-2.5 (3) MG/3ML IN SOLN
RESPIRATORY_TRACT | Status: AC
  Filled 2016-05-10: qty 3

## 2016-05-10 NOTE — ED Triage Notes (Signed)
Patient reports "bronchitis acting up" hard to breathe.  Productive cough, light yellow

## 2016-05-10 NOTE — Discharge Instructions (Signed)
User albuterol inhaler 2 puffs every 4 hours for wheezing and cough. Take an antihistamine such as Allegra, Claritin or Zyrtec daily for the next several days to help ward off allergy triggers. Drink plenty of fluids and stable hydrated. Strongly recommend stop smoking as soon as possible. Future medications refilled for your nebulizer and uses needed. Take the prednisone daily as directed with food.
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
## 2016-06-09 ENCOUNTER — Encounter (HOSPITAL_COMMUNITY): Payer: Self-pay | Admitting: *Deleted

## 2016-06-09 ENCOUNTER — Emergency Department (HOSPITAL_COMMUNITY): Payer: Self-pay

## 2016-06-09 ENCOUNTER — Emergency Department (HOSPITAL_COMMUNITY)
Admission: EM | Admit: 2016-06-09 | Discharge: 2016-06-09 | Disposition: A | Discharge status: Home / Self Care | Payer: Self-pay | Attending: Emergency Medicine | Admitting: Emergency Medicine

## 2016-06-09 DIAGNOSIS — R51 Headache: Secondary | ICD-10-CM | POA: Insufficient documentation

## 2016-06-09 DIAGNOSIS — F17210 Nicotine dependence, cigarettes, uncomplicated: Secondary | ICD-10-CM | POA: Insufficient documentation

## 2016-06-09 DIAGNOSIS — R42 Dizziness and giddiness: Secondary | ICD-10-CM | POA: Insufficient documentation

## 2016-06-09 DIAGNOSIS — R0789 Other chest pain: Secondary | ICD-10-CM | POA: Insufficient documentation

## 2016-06-09 DIAGNOSIS — J45909 Unspecified asthma, uncomplicated: Secondary | ICD-10-CM | POA: Insufficient documentation

## 2016-06-09 DIAGNOSIS — R0602 Shortness of breath: Secondary | ICD-10-CM | POA: Insufficient documentation

## 2016-06-09 DIAGNOSIS — J449 Chronic obstructive pulmonary disease, unspecified: Secondary | ICD-10-CM | POA: Insufficient documentation

## 2016-06-09 DIAGNOSIS — Z7982 Long term (current) use of aspirin: Secondary | ICD-10-CM | POA: Insufficient documentation

## 2016-06-09 LAB — CBC
HEMATOCRIT: 39.7 % (ref 39.0–52.0)
Hemoglobin: 13.3 g/dL (ref 13.0–17.0)
MCH: 30.5 pg (ref 26.0–34.0)
MCHC: 33.5 g/dL (ref 30.0–36.0)
MCV: 91.1 fL (ref 78.0–100.0)
Platelets: 187 10*3/uL (ref 150–400)
RBC: 4.36 MIL/uL (ref 4.22–5.81)
RDW: 13.9 % (ref 11.5–15.5)
WBC: 4.2 10*3/uL (ref 4.0–10.5)

## 2016-06-09 LAB — BASIC METABOLIC PANEL
Anion gap: 6 (ref 5–15)
BUN: 12 mg/dL (ref 6–20)
CALCIUM: 8.4 mg/dL — AB (ref 8.9–10.3)
CO2: 24 mmol/L (ref 22–32)
Chloride: 105 mmol/L (ref 101–111)
Creatinine, Ser: 1.26 mg/dL — ABNORMAL HIGH (ref 0.61–1.24)
GFR calc Af Amer: >60 mL/min (ref >60)
GLUCOSE: 95 mg/dL (ref 65–99)
POTASSIUM: 4.1 mmol/L (ref 3.5–5.1)
SODIUM: 135 mmol/L (ref 135–145)

## 2016-06-09 LAB — I-STAT TROPONIN, ED
TROPONIN I, POC: 0 ng/mL (ref 0.00–0.08)
Troponin i, poc: 0 ng/mL (ref 0.00–0.08)

## 2016-06-09 NOTE — ED Notes (Signed)
Pt is in stable condition upon d/c and ambulates from ED. 
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
## 2016-06-09 NOTE — ED Triage Notes (Signed)
Pt in from work via [employer], per report pt was at work when he had mid Chest tightness, pt reports feeling SOB & diaphoretic with episode, pt denies n/v/d, pt had ASA 324 mg at work, pt rcvd x1 sL nitro by EMS, pt pain free upon arrival to ED

## 2016-08-28 ENCOUNTER — Encounter (HOSPITAL_COMMUNITY): Payer: Self-pay

## 2016-08-28 ENCOUNTER — Ambulatory Visit (HOSPITAL_COMMUNITY)
Admission: EM | Admit: 2016-08-28 | Discharge: 2016-08-28 | Disposition: A | Discharge status: Home / Self Care | Payer: Self-pay | Attending: Family | Admitting: Family

## 2016-08-28 DIAGNOSIS — R05 Cough: Secondary | ICD-10-CM

## 2016-08-28 DIAGNOSIS — J4541 Moderate persistent asthma with (acute) exacerbation: Secondary | ICD-10-CM

## 2016-08-28 MED ORDER — ALBUTEROL SULFATE HFA 108 (90 BASE) MCG/ACT IN AERS: 1.0000 | INHALATION_SPRAY | Freq: Four times a day (QID) | RESPIRATORY_TRACT | 1 refills | Status: DC | PRN

## 2016-08-28 MED ORDER — TRIAMCINOLONE ACETONIDE 40 MG/ML IJ SUSP
INTRAMUSCULAR | Status: AC
  Filled 2016-08-28: qty 1

## 2016-08-28 MED ORDER — BUDESONIDE-FORMOTEROL FUMARATE 80-4.5 MCG/ACT IN AERO: 2.0000 | INHALATION_SPRAY | Freq: Two times a day (BID) | RESPIRATORY_TRACT | 1 refills | Status: DC

## 2016-08-28 MED ORDER — FLUTICASONE PROPIONATE 50 MCG/ACT NA SUSP: 2.0000 | Freq: Every day | NASAL | 1 refills | Status: DC

## 2016-08-28 MED ORDER — PREDNISONE 10 MG PO TABS: ORAL_TABLET | ORAL | 0 refills | Status: DC

## 2016-08-28 MED ORDER — TRIAMCINOLONE ACETONIDE 40 MG/ML IJ SUSP
40.0000 mg | Freq: Once | INTRAMUSCULAR | Status: AC
  Administered 2016-08-28: 40 mg via INTRAMUSCULAR

## 2016-08-28 NOTE — ED Triage Notes (Signed)
Pt presents today with sore throat and cough that started 2 weeks ago. Has been using an albuterol inhaler for SOB and wheezing prn and he does have an at home nebulizer machine that he states does help when he uses it. States he does not have a hx of asthma or COPD.
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
## 2016-08-28 NOTE — Discharge Instructions (Signed)
Start Flonase as this will help control postnasal drip  Use albuterol as rescue inhaler  trial of Symbicort -use every day twice a day to help prevent exacerbations Please establish with a primary care provider once you have insurance  If there is no improvement in your symptoms, or if there is any worsening of symptoms, or if you have any additional concerns, please return for re-evaluation; or, if we are closed, consider going to the Emergency Room for evaluation if symptoms urgent.

## 2016-10-11 ENCOUNTER — Encounter (HOSPITAL_COMMUNITY): Payer: Self-pay | Admitting: Emergency Medicine

## 2016-10-11 ENCOUNTER — Ambulatory Visit (HOSPITAL_COMMUNITY)
Admission: EM | Admit: 2016-10-11 | Discharge: 2016-10-11 | Disposition: A | Discharge status: Home / Self Care | Payer: No Typology Code available for payment source | Attending: Internal Medicine | Admitting: Internal Medicine

## 2016-10-11 DIAGNOSIS — K2900 Acute gastritis without bleeding: Secondary | ICD-10-CM | POA: Diagnosis not present

## 2016-10-11 MED ORDER — FAMOTIDINE 20 MG PO TABS
40.0000 mg | ORAL_TABLET | Freq: Once | ORAL | Status: AC
  Administered 2016-10-11: 40 mg via ORAL

## 2016-10-11 MED ORDER — OMEPRAZOLE 40 MG PO CPDR: 40.0000 mg | DELAYED_RELEASE_CAPSULE | Freq: Two times a day (BID) | ORAL | 1 refills | Status: DC

## 2016-10-11 MED ORDER — GI COCKTAIL ~~LOC~~
30.0000 mL | Freq: Once | ORAL | Status: AC
  Administered 2016-10-11: 30 mL via ORAL

## 2016-10-11 MED ORDER — GI COCKTAIL ~~LOC~~
ORAL | Status: AC
  Filled 2016-10-11: qty 30

## 2016-10-11 MED ORDER — FAMOTIDINE 20 MG PO TABS
ORAL_TABLET | ORAL | Status: AC
  Filled 2016-10-11: qty 2

## 2016-10-11 MED ORDER — FAMOTIDINE 40 MG PO TABS: 40.0000 mg | ORAL_TABLET | Freq: Two times a day (BID) | ORAL | 1 refills | Status: DC

## 2016-10-11 NOTE — ED Triage Notes (Addendum)
Pt has a chronic issue with acid buildup in his stomach. He was seen here and we put him on Protonix.  He states the medication helped, but only a little.  He states he will be seeing a PCP for the first time on Friday, but the epigastric pain was too much to wait 5 more days.  Pt is having issues with his medical card and may need some assistance with getting his medication at the cheapest rate possible.
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
## 2016-10-11 NOTE — Discharge Instructions (Addendum)
Anticipate gradual improvement in upper stomach discomfort over the next week or so.  Prescriptions for omeprazole and for famotidine, for stomach acid, were sent to the pharmacy.  These medicines are also available over the counter at lower doses.  Take both medicines to decrease acid and heal the stomach lining.  Continue trying to stop smoking.  Cutting back on beer and caffeine will also help with healing the stomach lining.  Keep appointment with primary care provider later this week.  Go to the ER for severe/sustained abdominal pain, vomiting blood, fever >100.5.

## 2016-11-16 ENCOUNTER — Ambulatory Visit (HOSPITAL_COMMUNITY)
Admission: EM | Admit: 2016-11-16 | Discharge: 2016-11-16 | Disposition: A | Discharge status: Home / Self Care | Payer: No Typology Code available for payment source | Attending: Nurse Practitioner | Admitting: Nurse Practitioner

## 2016-11-16 ENCOUNTER — Ambulatory Visit (INDEPENDENT_AMBULATORY_CARE_PROVIDER_SITE_OTHER): Payer: No Typology Code available for payment source

## 2016-11-16 ENCOUNTER — Encounter (HOSPITAL_COMMUNITY): Payer: Self-pay | Admitting: Emergency Medicine

## 2016-11-16 DIAGNOSIS — R05 Cough: Secondary | ICD-10-CM

## 2016-11-16 DIAGNOSIS — J441 Chronic obstructive pulmonary disease with (acute) exacerbation: Secondary | ICD-10-CM

## 2016-11-16 DIAGNOSIS — R059 Cough, unspecified: Secondary | ICD-10-CM

## 2016-11-16 DIAGNOSIS — R062 Wheezing: Secondary | ICD-10-CM

## 2016-11-16 MED ORDER — AZITHROMYCIN 250 MG PO TABS: 250.0000 mg | ORAL_TABLET | Freq: Every day | ORAL | 0 refills | Status: DC

## 2016-11-16 MED ORDER — PREDNISONE 10 MG (21) PO TBPK: ORAL_TABLET | Freq: Every day | ORAL | 0 refills | Status: DC
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
## 2016-11-16 NOTE — ED Triage Notes (Signed)
Pt sts cough x several days with hx of bronchitis; pt sts productive with brown sputum

## 2016-11-28 ENCOUNTER — Encounter (HOSPITAL_COMMUNITY): Payer: Self-pay | Admitting: *Deleted

## 2016-11-28 ENCOUNTER — Emergency Department (HOSPITAL_COMMUNITY)
Admission: EM | Admit: 2016-11-28 | Discharge: 2016-11-28 | Disposition: A | Discharge status: Home / Self Care | Payer: Self-pay | Attending: Emergency Medicine | Admitting: Emergency Medicine

## 2016-11-28 DIAGNOSIS — J4551 Severe persistent asthma with (acute) exacerbation: Secondary | ICD-10-CM | POA: Insufficient documentation

## 2016-11-28 DIAGNOSIS — J449 Chronic obstructive pulmonary disease, unspecified: Secondary | ICD-10-CM | POA: Insufficient documentation

## 2016-11-28 DIAGNOSIS — Z79899 Other long term (current) drug therapy: Secondary | ICD-10-CM | POA: Insufficient documentation

## 2016-11-28 DIAGNOSIS — F17210 Nicotine dependence, cigarettes, uncomplicated: Secondary | ICD-10-CM | POA: Insufficient documentation

## 2016-11-28 DIAGNOSIS — Z7982 Long term (current) use of aspirin: Secondary | ICD-10-CM | POA: Insufficient documentation

## 2016-11-28 DIAGNOSIS — F419 Anxiety disorder, unspecified: Secondary | ICD-10-CM | POA: Insufficient documentation

## 2016-11-28 LAB — BASIC METABOLIC PANEL
ANION GAP: 10 (ref 5–15)
BUN: 18 mg/dL (ref 6–20)
CALCIUM: 8.6 mg/dL — AB (ref 8.9–10.3)
CO2: 25 mmol/L (ref 22–32)
Chloride: 101 mmol/L (ref 101–111)
Creatinine, Ser: 1.38 mg/dL — ABNORMAL HIGH (ref 0.61–1.24)
GFR, EST NON AFRICAN AMERICAN: 59 mL/min — AB (ref >60)
Glucose, Bld: 95 mg/dL (ref 65–99)
POTASSIUM: 3.7 mmol/L (ref 3.5–5.1)
SODIUM: 136 mmol/L (ref 135–145)

## 2016-11-28 LAB — CBC WITH DIFFERENTIAL/PLATELET
BASOS ABS: 0 10*3/uL (ref 0.0–0.1)
BASOS PCT: 0 %
Eosinophils Absolute: 0.6 10*3/uL (ref 0.0–0.7)
Eosinophils Relative: 6 %
HEMATOCRIT: 43.6 % (ref 39.0–52.0)
Hemoglobin: 15.1 g/dL (ref 13.0–17.0)
LYMPHS PCT: 37 %
Lymphs Abs: 3.5 10*3/uL (ref 0.7–4.0)
MCH: 31.7 pg (ref 26.0–34.0)
MCHC: 34.6 g/dL (ref 30.0–36.0)
MCV: 91.6 fL (ref 78.0–100.0)
Monocytes Absolute: 0.8 10*3/uL (ref 0.1–1.0)
Monocytes Relative: 8 %
NEUTROS ABS: 4.5 10*3/uL (ref 1.7–7.7)
NEUTROS PCT: 49 %
Platelets: 236 10*3/uL (ref 150–400)
RBC: 4.76 MIL/uL (ref 4.22–5.81)
RDW: 13.6 % (ref 11.5–15.5)
WBC: 9.3 10*3/uL (ref 4.0–10.5)

## 2016-11-28 MED ORDER — ALBUTEROL (5 MG/ML) CONTINUOUS INHALATION SOLN
10.0000 mg/h | INHALATION_SOLUTION | Freq: Once | RESPIRATORY_TRACT | Status: AC
  Administered 2016-11-28: 10 mg/h via RESPIRATORY_TRACT
  Filled 2016-11-28: qty 2

## 2016-11-28 MED ORDER — IPRATROPIUM BROMIDE 0.02 % IN SOLN
0.5000 mg | Freq: Once | RESPIRATORY_TRACT | Status: AC
  Administered 2016-11-28: 0.5 mg via RESPIRATORY_TRACT
  Filled 2016-11-28: qty 2.5

## 2016-11-28 MED ORDER — ALBUTEROL SULFATE (2.5 MG/3ML) 0.083% IN NEBU
5.0000 mg | INHALATION_SOLUTION | Freq: Once | RESPIRATORY_TRACT | Status: AC
  Administered 2016-11-28: 5 mg via RESPIRATORY_TRACT
  Filled 2016-11-28: qty 6

## 2016-11-28 MED ORDER — PREDNISONE 50 MG PO TABS: ORAL_TABLET | ORAL | 0 refills | Status: DC

## 2016-11-28 MED ORDER — METHYLPREDNISOLONE SODIUM SUCC 125 MG IJ SOLR
125.0000 mg | Freq: Once | INTRAMUSCULAR | Status: AC
  Administered 2016-11-28: 125 mg via INTRAVENOUS
  Filled 2016-11-28: qty 2

## 2016-11-28 MED ORDER — MAGNESIUM SULFATE 2 GM/50ML IV SOLN
2.0000 g | Freq: Once | INTRAVENOUS | Status: AC
  Administered 2016-11-28: 2 g via INTRAVENOUS
  Filled 2016-11-28: qty 50

## 2016-11-28 MED ORDER — ALBUTEROL SULFATE HFA 108 (90 BASE) MCG/ACT IN AERS
2.0000 | INHALATION_SPRAY | Freq: Once | RESPIRATORY_TRACT | Status: AC
  Administered 2016-11-28: 2 via RESPIRATORY_TRACT
  Filled 2016-11-28: qty 6.7

## 2016-11-28 NOTE — ED Notes (Signed)
ED Provider at bedside. 

## 2016-11-28 NOTE — ED Notes (Signed)
Pt states increasing sob, worsening today. He is out of neb solution. Diaphoretic, labored and wheezing on assessment

## 2016-11-28 NOTE — ED Notes (Signed)
Ambulated patient in hallway, saturations 93-98% heart rate 99-103. Pt reports he is feeling better.
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
## 2016-12-29 ENCOUNTER — Encounter (HOSPITAL_COMMUNITY): Payer: Self-pay | Admitting: Emergency Medicine

## 2016-12-29 ENCOUNTER — Ambulatory Visit (HOSPITAL_COMMUNITY)
Admission: EM | Admit: 2016-12-29 | Discharge: 2016-12-29 | Disposition: A | Discharge status: Home / Self Care | Payer: Self-pay | Attending: Family Medicine | Admitting: Family Medicine

## 2016-12-29 ENCOUNTER — Other Ambulatory Visit: Payer: Self-pay

## 2016-12-29 DIAGNOSIS — R0602 Shortness of breath: Secondary | ICD-10-CM

## 2016-12-29 DIAGNOSIS — R062 Wheezing: Secondary | ICD-10-CM

## 2016-12-29 DIAGNOSIS — J441 Chronic obstructive pulmonary disease with (acute) exacerbation: Secondary | ICD-10-CM

## 2016-12-29 MED ORDER — IPRATROPIUM-ALBUTEROL 0.5-2.5 (3) MG/3ML IN SOLN
RESPIRATORY_TRACT | Status: AC
  Filled 2016-12-29: qty 3

## 2016-12-29 MED ORDER — AZITHROMYCIN 250 MG PO TABS: 250.0000 mg | ORAL_TABLET | Freq: Every day | ORAL | 0 refills | Status: DC

## 2016-12-29 MED ORDER — ALBUTEROL SULFATE (2.5 MG/3ML) 0.083% IN NEBU: 2.5000 mg | INHALATION_SOLUTION | Freq: Four times a day (QID) | RESPIRATORY_TRACT | 0 refills | Status: DC | PRN

## 2016-12-29 MED ORDER — ACETAMINOPHEN 325 MG PO TABS
ORAL_TABLET | ORAL | Status: AC
  Filled 2016-12-29: qty 2

## 2016-12-29 MED ORDER — IPRATROPIUM-ALBUTEROL 0.5-2.5 (3) MG/3ML IN SOLN
RESPIRATORY_TRACT | Status: AC
Start: 2016-12-29 — End: 2016-12-29
  Filled 2016-12-29: qty 3

## 2016-12-29 MED ORDER — IPRATROPIUM-ALBUTEROL 0.5-2.5 (3) MG/3ML IN SOLN
3.0000 mL | Freq: Once | RESPIRATORY_TRACT | Status: AC
  Administered 2016-12-29: 3 mL via RESPIRATORY_TRACT

## 2016-12-29 MED ORDER — METHYLPREDNISOLONE SODIUM SUCC 125 MG IJ SOLR
125.0000 mg | Freq: Once | INTRAMUSCULAR | Status: AC
  Administered 2016-12-29: 125 mg via INTRAMUSCULAR

## 2016-12-29 MED ORDER — PREDNISONE 20 MG PO TABS: 40.0000 mg | ORAL_TABLET | Freq: Every day | ORAL | 0 refills | Status: AC

## 2016-12-29 MED ORDER — ALBUTEROL SULFATE HFA 108 (90 BASE) MCG/ACT IN AERS: 1.0000 | INHALATION_SPRAY | Freq: Four times a day (QID) | RESPIRATORY_TRACT | 0 refills | Status: DC | PRN

## 2016-12-29 MED ORDER — METHYLPREDNISOLONE SODIUM SUCC 125 MG IJ SOLR
INTRAMUSCULAR | Status: AC
  Filled 2016-12-29: qty 2

## 2016-12-29 MED ORDER — ACETAMINOPHEN 325 MG PO TABS
650.0000 mg | ORAL_TABLET | Freq: Once | ORAL | Status: AC
  Administered 2016-12-29: 650 mg via ORAL

## 2016-12-29 MED ORDER — SODIUM CHLORIDE 0.9 % IN NEBU
INHALATION_SOLUTION | RESPIRATORY_TRACT | Status: AC
  Filled 2016-12-29: qty 3
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
## 2016-12-29 NOTE — ED Triage Notes (Signed)
Patient says he has "chronic bronchitis.. Breathing started messing with me"  Patient has been using nebulizer more frequently.  Breathing is getting worse.

## 2016-12-29 NOTE — Discharge Instructions (Signed)
I am treating you for exacerbation of chronic bronchitis. You were given a prednisone injection in office today. 2 doses of breathing treatment. Start azithromycin and prednisone as directed. I have refilled your albuterol inhaler and nebulizer. As discussed follow up with a PCP as soon as possible to help control asthma and chronic bronchitis as you have had these episodes almost every month. Keep hydrated, your urine should be clear to pale yellow in color. Tylenol/motrin for fever and pain. Monitor for any worsening of symptoms, chest pain, shortness of breath, wheezing, swelling of the throat, follow up for reevaluation.

## 2017-09-26 ENCOUNTER — Encounter: Payer: Self-pay | Admitting: Internal Medicine

## 2017-10-24 ENCOUNTER — Encounter: Payer: Self-pay | Admitting: Internal Medicine

## 2017-10-24 ENCOUNTER — Ambulatory Visit (AMBULATORY_SURGERY_CENTER): Payer: Self-pay | Admitting: *Deleted

## 2017-10-24 VITALS — Ht 71.0 in | Wt 224.0 lb

## 2017-10-24 DIAGNOSIS — Z1211 Encounter for screening for malignant neoplasm of colon: Secondary | ICD-10-CM

## 2017-10-24 MED ORDER — NA SULFATE-K SULFATE-MG SULF 17.5-3.13-1.6 GM/177ML PO SOLN: 1.0000 | Freq: Once | ORAL | 0 refills | Status: AC
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
## 2017-11-02 ENCOUNTER — Telehealth: Payer: Self-pay | Admitting: Internal Medicine

## 2017-11-03 ENCOUNTER — Encounter: Payer: Self-pay | Admitting: Internal Medicine

## 2018-07-03 ENCOUNTER — Ambulatory Visit
Admission: RE | Admit: 2018-07-03 | Discharge: 2018-07-03 | Disposition: A | Discharge status: Home / Self Care | Payer: BLUE CROSS/BLUE SHIELD | Source: Ambulatory Visit | Attending: Family Medicine | Admitting: Family Medicine

## 2018-07-03 ENCOUNTER — Other Ambulatory Visit: Payer: Self-pay | Admitting: Family Medicine

## 2018-07-03 ENCOUNTER — Other Ambulatory Visit: Payer: Self-pay

## 2018-07-03 DIAGNOSIS — R52 Pain, unspecified: Secondary | ICD-10-CM

## 2018-11-14 ENCOUNTER — Other Ambulatory Visit: Payer: Self-pay

## 2018-11-14 ENCOUNTER — Encounter (HOSPITAL_COMMUNITY): Payer: Self-pay | Admitting: Emergency Medicine

## 2018-11-14 ENCOUNTER — Ambulatory Visit (HOSPITAL_COMMUNITY)
Admission: EM | Admit: 2018-11-14 | Discharge: 2018-11-14 | Disposition: A | Discharge status: Home / Self Care | Payer: BC Managed Care – PPO | Attending: Family Medicine | Admitting: Family Medicine

## 2018-11-14 DIAGNOSIS — M25522 Pain in left elbow: Secondary | ICD-10-CM

## 2018-11-14 DIAGNOSIS — B353 Tinea pedis: Secondary | ICD-10-CM

## 2018-11-14 DIAGNOSIS — M25521 Pain in right elbow: Secondary | ICD-10-CM

## 2018-11-14 MED ORDER — CLOTRIMAZOLE 1 % EX CREA: TOPICAL_CREAM | CUTANEOUS | 0 refills | Status: DC

## 2018-11-14 MED ORDER — MELOXICAM 15 MG PO TABS: 15.0000 mg | ORAL_TABLET | Freq: Every day | ORAL | 0 refills | Status: DC

## 2018-11-14 MED ORDER — FLUCONAZOLE 100 MG PO TABS: ORAL_TABLET | ORAL | 0 refills | Status: DC
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
## 2018-11-14 NOTE — ED Notes (Signed)
Patient states at check in that he has just finished his 14 Isolation after testing positive for Covid-19

## 2018-11-14 NOTE — ED Triage Notes (Signed)
PT was diagnosed with COVID mid October. PT has been in quarantine. PT reports pain in elbows and other joints and worsening athletes foot since COVID

## 2019-04-05 ENCOUNTER — Ambulatory Visit (HOSPITAL_COMMUNITY)
Admission: AD | Admit: 2019-04-05 | Discharge: 2019-04-05 | Disposition: A | Discharge status: Home / Self Care | Payer: Self-pay | Attending: Psychiatry | Admitting: Psychiatry

## 2019-04-05 ENCOUNTER — Telehealth (HOSPITAL_COMMUNITY): Payer: Self-pay | Admitting: Psychiatry

## 2019-04-05 DIAGNOSIS — Z1389 Encounter for screening for other disorder: Secondary | ICD-10-CM | POA: Insufficient documentation

## 2019-04-05 DIAGNOSIS — Z800 Family history of malignant neoplasm of digestive organs: Secondary | ICD-10-CM | POA: Insufficient documentation

## 2019-04-05 DIAGNOSIS — F419 Anxiety disorder, unspecified: Secondary | ICD-10-CM | POA: Insufficient documentation

## 2019-04-05 DIAGNOSIS — I10 Essential (primary) hypertension: Secondary | ICD-10-CM | POA: Insufficient documentation

## 2019-04-05 DIAGNOSIS — Z888 Allergy status to other drugs, medicaments and biological substances status: Secondary | ICD-10-CM | POA: Insufficient documentation

## 2019-04-05 DIAGNOSIS — Z818 Family history of other mental and behavioral disorders: Secondary | ICD-10-CM | POA: Insufficient documentation

## 2019-04-05 DIAGNOSIS — F329 Major depressive disorder, single episode, unspecified: Secondary | ICD-10-CM | POA: Insufficient documentation
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
## 2019-04-05 NOTE — Telephone Encounter (Signed)
D:  [place] (TTS) referred pt to [facility].  A:  Placed call to orient pt, but there was no answer.  Left vm for patient to call case manager back.  Inform [place].

## 2019-04-24 ENCOUNTER — Telehealth (HOSPITAL_COMMUNITY): Payer: Self-pay | Admitting: Psychiatry

## 2019-04-24 NOTE — Telephone Encounter (Signed)
D:  Stephen Chapman, LCSW, LCAS referred pt to [facility].  A:  Placed call to pt but there was no answer.  Left vm for patient to call case manager.  Inform Stephen Miller.

## 2019-05-10 ENCOUNTER — Ambulatory Visit (HOSPITAL_COMMUNITY)
Admission: RE | Admit: 2019-05-10 | Discharge: 2019-05-10 | Disposition: A | Discharge status: Home / Self Care | Payer: BC Managed Care – PPO | Attending: Psychiatry | Admitting: Psychiatry
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
## 2019-05-17 ENCOUNTER — Encounter: Payer: Self-pay | Admitting: Gastroenterology

## 2019-07-06 ENCOUNTER — Encounter: Payer: Self-pay | Admitting: Gastroenterology

## 2019-08-21 ENCOUNTER — Ambulatory Visit (HOSPITAL_COMMUNITY)
Admission: EM | Admit: 2019-08-21 | Discharge: 2019-08-21 | Disposition: A | Discharge status: Home / Self Care | Payer: BC Managed Care – PPO | Attending: Family Medicine | Admitting: Family Medicine

## 2019-08-21 ENCOUNTER — Encounter (HOSPITAL_COMMUNITY): Payer: Self-pay

## 2019-08-21 ENCOUNTER — Other Ambulatory Visit: Payer: Self-pay

## 2019-08-21 DIAGNOSIS — J4541 Moderate persistent asthma with (acute) exacerbation: Secondary | ICD-10-CM

## 2019-08-21 MED ORDER — ALBUTEROL SULFATE HFA 108 (90 BASE) MCG/ACT IN AERS
INHALATION_SPRAY | RESPIRATORY_TRACT | Status: AC
  Filled 2019-08-21: qty 6.7

## 2019-08-21 MED ORDER — PREDNISONE 10 MG (21) PO TBPK
ORAL_TABLET | Freq: Every day | ORAL | 0 refills | Status: DC
Start: 2019-08-21 — End: 2019-09-04

## 2019-08-21 MED ORDER — ALBUTEROL SULFATE HFA 108 (90 BASE) MCG/ACT IN AERS: 1.0000 | INHALATION_SPRAY | Freq: Four times a day (QID) | RESPIRATORY_TRACT | 2 refills | Status: DC | PRN

## 2019-08-21 MED ORDER — ALBUTEROL SULFATE HFA 108 (90 BASE) MCG/ACT IN AERS
2.0000 | INHALATION_SPRAY | Freq: Once | RESPIRATORY_TRACT | Status: AC
  Administered 2019-08-21: 2 via RESPIRATORY_TRACT

## 2019-08-21 MED ORDER — ALBUTEROL SULFATE (2.5 MG/3ML) 0.083% IN NEBU: 2.5000 mg | INHALATION_SOLUTION | Freq: Four times a day (QID) | RESPIRATORY_TRACT | 2 refills | Status: DC | PRN

## 2019-08-21 NOTE — ED Triage Notes (Signed)
Pt is here with a asthma flare up, pt states he ran out of his inhaler 3 days ago. Pt states he does have a nebulizer at home but no meds to put in it.
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
## 2019-09-04 ENCOUNTER — Encounter (HOSPITAL_COMMUNITY): Payer: Self-pay

## 2019-09-04 ENCOUNTER — Ambulatory Visit (HOSPITAL_COMMUNITY)
Admission: EM | Admit: 2019-09-04 | Discharge: 2019-09-04 | Disposition: A | Discharge status: Home / Self Care | Payer: Self-pay | Attending: Physician Assistant | Admitting: Physician Assistant

## 2019-09-04 ENCOUNTER — Other Ambulatory Visit: Payer: Self-pay

## 2019-09-04 DIAGNOSIS — J4541 Moderate persistent asthma with (acute) exacerbation: Secondary | ICD-10-CM

## 2019-09-04 MED ORDER — PREDNISONE 10 MG PO TABS: 40.0000 mg | ORAL_TABLET | Freq: Every day | ORAL | 0 refills | Status: AC

## 2019-09-04 MED ORDER — IPRATROPIUM-ALBUTEROL 0.5-2.5 (3) MG/3ML IN SOLN: 3.0000 mL | RESPIRATORY_TRACT | 0 refills | Status: DC | PRN

## 2019-09-04 MED ORDER — AZITHROMYCIN 250 MG PO TABS
250.0000 mg | ORAL_TABLET | Freq: Every day | ORAL | 0 refills | Status: DC
Start: 2019-09-04 — End: 2019-09-19

## 2019-09-04 MED ORDER — CETIRIZINE HCL 10 MG PO TABS
10.0000 mg | ORAL_TABLET | Freq: Every day | ORAL | 0 refills | Status: DC
Start: 2019-09-04 — End: 2020-12-29
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
## 2019-09-04 NOTE — Discharge Instructions (Addendum)
Take the medications as prescribed Use nebulizer ever 4 hours while awake for next 24 hours Albuterol as needed- there is a refill at pharmacy Restart your daily inhaler  Start zyrtec daily  If not improving return  If severely worsening, go to the Emergency department  Call internal medicine center tomorrow, take first available appointment

## 2019-09-04 NOTE — ED Triage Notes (Signed)
Pt presents with continued shortness of breath; pt states inhaler and nebulizer is not giving him any relief.

## 2019-09-19 ENCOUNTER — Encounter (HOSPITAL_COMMUNITY): Payer: Self-pay

## 2019-09-19 ENCOUNTER — Emergency Department (HOSPITAL_COMMUNITY)
Admission: EM | Admit: 2019-09-19 | Discharge: 2019-09-19 | Disposition: A | Discharge status: Home / Self Care | Payer: Self-pay | Attending: Emergency Medicine | Admitting: Emergency Medicine

## 2019-09-19 ENCOUNTER — Emergency Department (HOSPITAL_COMMUNITY): Payer: Self-pay

## 2019-09-19 ENCOUNTER — Other Ambulatory Visit: Payer: Self-pay

## 2019-09-19 DIAGNOSIS — Z20822 Contact with and (suspected) exposure to covid-19: Secondary | ICD-10-CM | POA: Insufficient documentation

## 2019-09-19 DIAGNOSIS — J4541 Moderate persistent asthma with (acute) exacerbation: Secondary | ICD-10-CM | POA: Insufficient documentation

## 2019-09-19 DIAGNOSIS — Z79899 Other long term (current) drug therapy: Secondary | ICD-10-CM | POA: Insufficient documentation

## 2019-09-19 DIAGNOSIS — Z87891 Personal history of nicotine dependence: Secondary | ICD-10-CM | POA: Insufficient documentation

## 2019-09-19 DIAGNOSIS — I10 Essential (primary) hypertension: Secondary | ICD-10-CM | POA: Insufficient documentation

## 2019-09-19 LAB — CBC WITH DIFFERENTIAL/PLATELET
Abs Immature Granulocytes: 0.01 10*3/uL (ref 0.00–0.07)
Basophils Absolute: 0 10*3/uL (ref 0.0–0.1)
Basophils Relative: 1 %
Eosinophils Absolute: 0.3 10*3/uL (ref 0.0–0.5)
Eosinophils Relative: 6 %
HCT: 42.3 % (ref 39.0–52.0)
Hemoglobin: 13.9 g/dL (ref 13.0–17.0)
Immature Granulocytes: 0 %
Lymphocytes Relative: 37 %
Lymphs Abs: 1.8 10*3/uL (ref 0.7–4.0)
MCH: 30.5 pg (ref 26.0–34.0)
MCHC: 32.9 g/dL (ref 30.0–36.0)
MCV: 93 fL (ref 80.0–100.0)
Monocytes Absolute: 0.6 10*3/uL (ref 0.1–1.0)
Monocytes Relative: 12 %
Neutro Abs: 2.2 10*3/uL (ref 1.7–7.7)
Neutrophils Relative %: 44 %
Platelets: 226 10*3/uL (ref 150–400)
RBC: 4.55 MIL/uL (ref 4.22–5.81)
RDW: 13.1 % (ref 11.5–15.5)
WBC: 4.9 10*3/uL (ref 4.0–10.5)
nRBC: 0 % (ref 0.0–0.2)

## 2019-09-19 LAB — BASIC METABOLIC PANEL
Anion gap: 11 (ref 5–15)
BUN: 12 mg/dL (ref 6–20)
CO2: 23 mmol/L (ref 22–32)
Calcium: 8.8 mg/dL — ABNORMAL LOW (ref 8.9–10.3)
Chloride: 103 mmol/L (ref 98–111)
Creatinine, Ser: 1.38 mg/dL — ABNORMAL HIGH (ref 0.61–1.24)
GFR calc Af Amer: >60 mL/min (ref >60)
GFR calc non Af Amer: 58 mL/min — ABNORMAL LOW (ref >60)
Glucose, Bld: 84 mg/dL (ref 70–99)
Potassium: 3.7 mmol/L (ref 3.5–5.1)
Sodium: 137 mmol/L (ref 135–145)

## 2019-09-19 LAB — SARS CORONAVIRUS 2 BY RT PCR (HOSPITAL ORDER, PERFORMED IN ~~LOC~~ HOSPITAL LAB): SARS Coronavirus 2: NEGATIVE

## 2019-09-19 MED ORDER — HYDROCHLOROTHIAZIDE 25 MG PO TABS
25.0000 mg | ORAL_TABLET | Freq: Once | ORAL | Status: AC
  Administered 2019-09-19: 25 mg via ORAL
  Filled 2019-09-19: qty 1

## 2019-09-19 MED ORDER — MAGNESIUM SULFATE 2 GM/50ML IV SOLN
2.0000 g | Freq: Once | INTRAVENOUS | Status: AC
  Administered 2019-09-19: 2 g via INTRAVENOUS
  Filled 2019-09-19: qty 50

## 2019-09-19 MED ORDER — AEROCHAMBER PLUS FLO-VU LARGE MISC
1.0000 | Freq: Once | Status: AC
  Administered 2019-09-19: 1

## 2019-09-19 MED ORDER — HYDROCHLOROTHIAZIDE 25 MG PO TABS: 25.0000 mg | ORAL_TABLET | Freq: Every morning | ORAL | 1 refills | Status: DC

## 2019-09-19 MED ORDER — PREDNISONE 20 MG PO TABS
ORAL_TABLET | ORAL | 0 refills | Status: DC
Start: 2019-09-20 — End: 2019-10-19

## 2019-09-19 MED ORDER — ALBUTEROL SULFATE HFA 108 (90 BASE) MCG/ACT IN AERS
8.0000 | INHALATION_SPRAY | Freq: Once | RESPIRATORY_TRACT | Status: AC
  Administered 2019-09-19: 8 via RESPIRATORY_TRACT
  Filled 2019-09-19: qty 6.7

## 2019-09-19 MED ORDER — AEROCHAMBER PLUS FLO-VU LARGE MISC
Status: AC
  Filled 2019-09-19: qty 1

## 2019-09-19 MED ORDER — IPRATROPIUM BROMIDE HFA 17 MCG/ACT IN AERS
2.0000 | INHALATION_SPRAY | Freq: Once | RESPIRATORY_TRACT | Status: AC
  Administered 2019-09-19: 2 via RESPIRATORY_TRACT
  Filled 2019-09-19: qty 12.9

## 2019-09-19 MED ORDER — PREDNISONE 20 MG PO TABS
60.0000 mg | ORAL_TABLET | Freq: Once | ORAL | Status: AC
  Administered 2019-09-19: 60 mg via ORAL
  Filled 2019-09-19: qty 3

## 2019-09-19 NOTE — ED Triage Notes (Signed)
Patient complains of SOB. States that he has had cough that is worse at night. Vaccinated. Patient alert and oriented. States that he is to have Bipap but never fitted
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
## 2019-09-19 NOTE — Discharge Instructions (Addendum)
You were given your blood pressure medicine today as well as your first dose of prednisone.  Restart these both tomorrow.  If at any point you develop coughing up blood, fever, vomiting, chest pain, or new or worsening shortness of breath and return to the ER for evaluation.

## 2019-10-19 ENCOUNTER — Encounter: Payer: Self-pay | Admitting: Internal Medicine

## 2019-10-19 ENCOUNTER — Telehealth: Payer: Self-pay | Admitting: Internal Medicine

## 2019-10-19 ENCOUNTER — Other Ambulatory Visit: Payer: Self-pay

## 2019-10-19 ENCOUNTER — Ambulatory Visit (INDEPENDENT_AMBULATORY_CARE_PROVIDER_SITE_OTHER): Payer: Self-pay | Admitting: Internal Medicine

## 2019-10-19 ENCOUNTER — Encounter: Payer: Self-pay | Admitting: *Deleted

## 2019-10-19 VITALS — BP 128/90 | HR 86 | Temp 98.0°F | Ht 71.0 in | Wt 232.4 lb

## 2019-10-19 DIAGNOSIS — J4540 Moderate persistent asthma, uncomplicated: Secondary | ICD-10-CM

## 2019-10-19 DIAGNOSIS — Z720 Tobacco use: Secondary | ICD-10-CM

## 2019-10-19 MED ORDER — TRELEGY ELLIPTA 100-62.5-25 MCG/INH IN AEPB
1.0000 | INHALATION_SPRAY | Freq: Every day | RESPIRATORY_TRACT | 0 refills | Status: DC
Start: 2019-10-19 — End: 2020-06-02

## 2019-10-19 MED ORDER — ALBUTEROL SULFATE HFA 108 (90 BASE) MCG/ACT IN AERS: 2.0000 | INHALATION_SPRAY | Freq: Four times a day (QID) | RESPIRATORY_TRACT | 5 refills | Status: DC | PRN

## 2019-10-19 NOTE — Patient Instructions (Addendum)
The patient should have follow up scheduled with myself in 3 months.   Take the albuterol rescue inhaler every 4 to 6 hours as needed for wheezing or shortness of breath. You can also take it 15 minutes before exercise or exertional activity. Side effects include heart racing or pounding, jitters or anxiety. If you have a history of an irregular heart rhythm, it can make this worse. Can also give some patients a hard time sleeping.  To inhale the aerosol using an inhaler, follow these steps:  1. Remove the protective dust cap from the end of the mouthpiece. If the dust cap was not placed on the mouthpiece, check the mouthpiece for dirt or other objects. Be sure that the canister is fully and firmly inserted in the mouthpiece. 2. If you are using the inhaler for the first time or if you have not used the inhaler in more than 14 days, you will need to prime it. You may also need to prime the inhaler if it has been dropped. Ask your pharmacist or check the manufacturer's information if this happens. To prime the inhaler, shake it well and then press down on the canister 4 times to release 4 sprays into the air, away from your face. Be careful not to get albuterol in your eyes. 3. Shake the inhaler well. 4. Breathe out as completely as possible through your mouth. 4. Hold the canister with the mouthpiece on the bottom, facing you and the canister pointing upward. Place the open end of the mouthpiece into your mouth. Close your lips tightly around the mouthpiece. 6. Breathe in slowly and deeply through the mouthpiece.At the same time, press down once on the container to spray the medication into your mouth. 7. Try to hold your breath for 10 seconds. remove the inhaler, and breathe out slowly. 8. If you were told to use 2 puffs, wait 1 minute and then repeat steps 3-7. 9. Replace the protective cap on the inhaler. 10. Clean your inhaler regularly. Follow the manufacturer's directions carefully and ask  your doctor or pharmacist if you have any questions about cleaning your inhaler.  Check the back of the inhaler to keep track of the total number of doses left on the inhaler.    By learning about asthma and how it can be controlled, you take an important step toward managing this disease. Work closely with your asthma care team to learn all you can about your asthma, how to avoid triggers, what your medications do, and how to take them correctly. With proper care, you can live free of asthma symptoms and maintain a normal, healthy lifestyle.   What is asthma? Asthma is a chronic disease that affects the airways of the lungs. During normal breathing, the bands of muscle that surround the airways are relaxed and air moves freely. During an asthma episode or "attack," there are three main changes that stop air from moving easily through the airways:  The bands of muscle that surround the airways tighten and make the airways narrow. This tightening is called bronchospasm.   The lining of the airways becomes swollen or inflamed.   The cells that line the airways produce more mucus, which is thicker than normal and clogs the airways.  These three factors - bronchospasm, inflammation, and mucus production - cause symptoms such as difficulty breathing, wheezing, and coughing.  What are the most common symptoms of asthma? Asthma symptoms are not the same for everyone. They can even change from episode to   episode in the same person. Also, you may have only one symptom of asthma, such as cough, but another person may have all the symptoms of asthma. It is important to know all the symptoms of asthma and to be aware that your asthma can present in any of these ways at any time. The most common symptoms include: . Coughing, especially at night  . Shortness of breath  . Wheezing  . Chest tightness, pain, or pressure   Who is affected by asthma? Asthma affects 22 million Americans; about 6 million of  these are children under age 18. People who have a family history of asthma have an increased risk of developing the disease. Asthma is also more common in people who have allergies or who are exposed to tobacco smoke. However, anyone can develop asthma at any time. Some people may have asthma all of their lives, while others may develop it as adults.  What causes asthma? The airways in a person with asthma are very sensitive and react to many things, or "triggers." Contact with these triggers causes asthma symptoms. One of the most important parts of asthma control is to identify your triggers and then avoid them when possible. The only trigger you do not want to avoid is exercise. Pre-treatment with medicines before exercise can allow you to stay active yet avoid asthma symptoms. Common asthma triggers include: 1. Infections (colds, viruses, flu, sinus infections)  2. Exercise  3. Weather (changes in temperature and/or humidity, cold air)  4. Tobacco smoke  5. Allergens (dust mites, pollens, pets, mold spores, cockroaches, and sometimes foods)  6. Irritants (strong odors from cleaning products, perfume, wood smoke, air pollution)  7. Strong emotions such as crying or laughing hard  8. Some medications   How is asthma diagnosed? To diagnose asthma, your doctor will first review your medical history, family history, and symptoms. Your doctor will want to know any past history of breathing problems you may have had, as well as a family history of asthma, allergies, eczema (a bumpy, itchy skin rash caused by allergies), or other lung disease. It is important that you describe your symptoms in detail (cough, wheeze, shortness of breath, chest tightness), including when and how often they occur. The doctor will perform a physical examination and listen to your heart and lungs. He or she may also order breathing tests, allergy tests, blood tests, and chest and sinus X-rays. The tests will find out if you  do have asthma and if there are any other conditions that are contributing factors.  How is asthma treated? Asthma can be controlled, but not cured. It is not normal to have frequent symptoms, trouble sleeping, or trouble completing tasks. Appropriate asthma care will prevent symptoms and visits to the emergency room and hospital. Asthma medicines are one of the mainstays of asthma treatment. The drugs used to treat asthma are explained below.  Anti-inflammatories: These are the most important drugs for most people with asthma. Anti-inflammatory drugs reduce swelling and mucus production in the airways. As a result, airways are less sensitive and less likely to react to triggers. These medications need to be taken daily and may need to be taken for several weeks before they begin to control asthma. Anti-inflammatory medicines lead to fewer symptoms, better airflow, less sensitive airways, less airway damage, and fewer asthma attacks. If taken every day, they CONTROL or prevent asthma symptoms.   Bronchodilators: These drugs relax the muscle bands that tighten around the airways. This action opens   the airways, letting more air in and out of the lungs and improving breathing. Bronchodilators also help clear mucus from the lungs. As the airways open, the mucus moves more freely and can be coughed out more easily. In short-acting forms, bronchodilators RELIEVE or stop asthma symptoms by quickly opening the airways and are very helpful during an asthma episode. In long-acting forms, bronchodilators provide CONTROL of asthma symptoms and prevent asthma episodes.  Asthma drugs can be taken in a variety of ways. Inhaling the medications by using a metered dose inhaler, dry powder inhaler, or nebulizer is one way of taking asthma medicines. Oral medicines (pills or liquids you swallow) may also be prescribed.  Asthma severity Asthma is classified as either "intermittent" (comes and goes) or "persistent" (lasting).  Persistent asthma is further described as being mild, moderate, or severe. The severity of asthma is based on how often you have symptoms both during the day and night, as well as by the results of lung function tests and by how well you can perform activities. The "severity" of asthma refers to how "intense" or "strong" your asthma is.  Asthma control Asthma control is the goal of asthma treatment. Regardless of your asthma severity, it may or may not be controlled. Asthma control means: . You are able to do everything you want to do at work and home  . You have no (or minimal) asthma symptoms  . You do not wake up from your sleep or earlier than usual in the morning due to asthma  . You rarely need to use your reliever medicine (inhaler)  Another major part of your treatment is that you are happy with your asthma care and believe your asthma is controlled.  Monitoring symptoms A key part of treatment is keeping track of how well your lungs are working. Monitoring your symptoms  what they are, how and when they happen, and how severe they are  is an important part of being able to control your asthma.  Sometimes asthma is monitored using a peak flow meter. A peak flow (PF) meter measures how fast the air comes out of your lungs. It can help you know when your asthma is getting worse, sometimes even before you have symptoms. By taking daily peak flow readings, you can learn when to adjust medications to keep asthma under good control. It is also used to create your asthma action plan (see below). Your doctor can use your peak flow readings to adjust your treatment plan in some cases.  Asthma Action Plan Based on your history and asthma severity, you and your doctor will develop a care plan called an "asthma action plan." The asthma action plan describes when and how to use your medicines, actions to take when asthma worsens, and when to seek emergency care. Make sure you understand this plan. If  you do not, ask your asthma care provider any questions you may have. Your asthma action plan is one of the keys to controlling asthma. Keep it readily available to remind you of what you need to do every day to control asthma and what you need to do when symptoms occur.  Goals of asthma therapy These are the goals of asthma treatment: . Live an active, normal life  . Prevent chronic and troublesome symptoms  . Attend work or school every day  . Perform daily activities without difficulty  . Stop urgent visits to the doctor, emergency department, or hospital  . Use and adjust medications to control   asthma with few or no side effects    
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
## 2019-10-19 NOTE — Telephone Encounter (Signed)
Spoke with the pt  He is asking if he can have letter stating he was here for appt today  I have created letter and uploaded to mychart per pt request Nothing further needed

## 2019-10-29 ENCOUNTER — Emergency Department (HOSPITAL_BASED_OUTPATIENT_CLINIC_OR_DEPARTMENT_OTHER)
Admission: EM | Admit: 2019-10-29 | Discharge: 2019-10-29 | Disposition: A | Discharge status: Home / Self Care | Payer: Self-pay | Attending: Emergency Medicine | Admitting: Emergency Medicine

## 2019-10-29 ENCOUNTER — Encounter (HOSPITAL_BASED_OUTPATIENT_CLINIC_OR_DEPARTMENT_OTHER): Payer: Self-pay | Admitting: Emergency Medicine

## 2019-10-29 ENCOUNTER — Emergency Department (HOSPITAL_BASED_OUTPATIENT_CLINIC_OR_DEPARTMENT_OTHER): Payer: Self-pay

## 2019-10-29 ENCOUNTER — Other Ambulatory Visit: Payer: Self-pay

## 2019-10-29 DIAGNOSIS — J441 Chronic obstructive pulmonary disease with (acute) exacerbation: Secondary | ICD-10-CM | POA: Insufficient documentation

## 2019-10-29 DIAGNOSIS — R0602 Shortness of breath: Secondary | ICD-10-CM

## 2019-10-29 DIAGNOSIS — F17210 Nicotine dependence, cigarettes, uncomplicated: Secondary | ICD-10-CM | POA: Insufficient documentation

## 2019-10-29 DIAGNOSIS — Z79899 Other long term (current) drug therapy: Secondary | ICD-10-CM | POA: Insufficient documentation

## 2019-10-29 DIAGNOSIS — Z7951 Long term (current) use of inhaled steroids: Secondary | ICD-10-CM | POA: Insufficient documentation

## 2019-10-29 MED ORDER — PREDNISONE 20 MG PO TABS
40.0000 mg | ORAL_TABLET | Freq: Every day | ORAL | 0 refills | Status: AC
Start: 2019-10-29 — End: 2019-11-02

## 2019-10-29 MED ORDER — METHYLPREDNISOLONE SODIUM SUCC 125 MG IJ SOLR
125.0000 mg | Freq: Once | INTRAMUSCULAR | Status: AC
  Administered 2019-10-29: 125 mg via INTRAVENOUS
  Filled 2019-10-29: qty 2

## 2019-10-29 MED ORDER — AZITHROMYCIN 250 MG PO TABS
250.0000 mg | ORAL_TABLET | Freq: Every day | ORAL | 0 refills | Status: DC
Start: 2019-10-29 — End: 2020-02-28

## 2019-10-29 MED ORDER — ALBUTEROL SULFATE (2.5 MG/3ML) 0.083% IN NEBU: 2.5000 mg | INHALATION_SOLUTION | Freq: Four times a day (QID) | RESPIRATORY_TRACT | 12 refills | Status: DC | PRN

## 2019-10-29 MED ORDER — ALBUTEROL SULFATE HFA 108 (90 BASE) MCG/ACT IN AERS
2.0000 | INHALATION_SPRAY | Freq: Once | RESPIRATORY_TRACT | Status: AC
  Administered 2019-10-29: 4 via RESPIRATORY_TRACT
  Filled 2019-10-29: qty 6.7

## 2019-10-29 MED ORDER — IPRATROPIUM-ALBUTEROL 0.5-2.5 (3) MG/3ML IN SOLN
3.0000 mL | Freq: Once | RESPIRATORY_TRACT | Status: AC
  Administered 2019-10-29: 3 mL via RESPIRATORY_TRACT
  Filled 2019-10-29: qty 3

## 2019-10-29 MED ORDER — ALBUTEROL SULFATE HFA 108 (90 BASE) MCG/ACT IN AERS: 1.0000 | INHALATION_SPRAY | Freq: Four times a day (QID) | RESPIRATORY_TRACT | 3 refills | Status: DC | PRN

## 2019-10-29 MED ORDER — ALBUTEROL SULFATE (2.5 MG/3ML) 0.083% IN NEBU
2.5000 mg | INHALATION_SOLUTION | Freq: Once | RESPIRATORY_TRACT | Status: AC
  Administered 2019-10-29: 2.5 mg via RESPIRATORY_TRACT
  Filled 2019-10-29: qty 3

## 2019-10-29 NOTE — Discharge Instructions (Signed)
As we discussed, your chest x-ray showed questionable beginning infection which can sometimes happen with COPD versus asthma exacerbation.  We will plan to treat this with antibiotics.  Additionally, use rescue inhaler As directed.  Take prednisone directed.  Use your nebulizer machine as directed.  Call your pulmonology doctor and tell them that you were here and see if they want to follow-up with you.  Return to the emergency department for any difficulty breathing, vomiting, chest pain or any worsening concerning symptoms.
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
## 2019-10-29 NOTE — ED Notes (Signed)
Patient out of Albuterol sol'n at home.

## 2019-10-29 NOTE — ED Triage Notes (Signed)
SOB x 2 days. Using inhaler without relief.

## 2019-11-20 ENCOUNTER — Encounter (HOSPITAL_COMMUNITY): Payer: Self-pay | Admitting: Emergency Medicine

## 2019-11-20 ENCOUNTER — Ambulatory Visit (HOSPITAL_COMMUNITY)
Admission: EM | Admit: 2019-11-20 | Discharge: 2019-11-20 | Disposition: A | Discharge status: Home / Self Care | Payer: HRSA Program | Attending: Family Medicine | Admitting: Family Medicine

## 2019-11-20 ENCOUNTER — Other Ambulatory Visit: Payer: Self-pay

## 2019-11-20 DIAGNOSIS — J441 Chronic obstructive pulmonary disease with (acute) exacerbation: Secondary | ICD-10-CM | POA: Insufficient documentation

## 2019-11-20 DIAGNOSIS — Z20822 Contact with and (suspected) exposure to covid-19: Secondary | ICD-10-CM | POA: Insufficient documentation

## 2019-11-20 LAB — SARS CORONAVIRUS 2 (TAT 6-24 HRS): SARS Coronavirus 2: NEGATIVE

## 2019-11-20 MED ORDER — METHYLPREDNISOLONE SODIUM SUCC 125 MG IJ SOLR
80.0000 mg | Freq: Once | INTRAMUSCULAR | Status: AC
  Administered 2019-11-20: 80 mg via INTRAMUSCULAR

## 2019-11-20 MED ORDER — METHYLPREDNISOLONE SODIUM SUCC 125 MG IJ SOLR
INTRAMUSCULAR | Status: AC
  Filled 2019-11-20: qty 2

## 2019-11-20 MED ORDER — PREDNISONE 10 MG PO TABS: 40.0000 mg | ORAL_TABLET | Freq: Every day | ORAL | 0 refills | Status: AC

## 2019-11-20 NOTE — ED Triage Notes (Signed)
PT reports cough and shortness of breath for 3 days. Denies fever, sore throat, headache, loss of taste or smell
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
## 2019-11-20 NOTE — Discharge Instructions (Addendum)
Steroids as prescribed Make sure that you are using the steroid inhaler the fluticasone every day.  Albuterol nebulizer and inhaler as needed.  Steroid shot given here  Zyrtec daily.  Follow up as needed for continued or worsening symptoms

## 2020-02-28 ENCOUNTER — Emergency Department (HOSPITAL_BASED_OUTPATIENT_CLINIC_OR_DEPARTMENT_OTHER)
Admission: EM | Admit: 2020-02-28 | Discharge: 2020-02-28 | Disposition: A | Discharge status: Home / Self Care | Payer: Self-pay | Attending: Emergency Medicine | Admitting: Emergency Medicine

## 2020-02-28 ENCOUNTER — Other Ambulatory Visit: Payer: Self-pay

## 2020-02-28 ENCOUNTER — Encounter (HOSPITAL_BASED_OUTPATIENT_CLINIC_OR_DEPARTMENT_OTHER): Payer: Self-pay | Admitting: Emergency Medicine

## 2020-02-28 DIAGNOSIS — J441 Chronic obstructive pulmonary disease with (acute) exacerbation: Secondary | ICD-10-CM | POA: Insufficient documentation

## 2020-02-28 DIAGNOSIS — Z7951 Long term (current) use of inhaled steroids: Secondary | ICD-10-CM | POA: Insufficient documentation

## 2020-02-28 DIAGNOSIS — M79641 Pain in right hand: Secondary | ICD-10-CM | POA: Insufficient documentation

## 2020-02-28 DIAGNOSIS — F17210 Nicotine dependence, cigarettes, uncomplicated: Secondary | ICD-10-CM | POA: Insufficient documentation

## 2020-02-28 DIAGNOSIS — J45909 Unspecified asthma, uncomplicated: Secondary | ICD-10-CM | POA: Insufficient documentation

## 2020-02-28 NOTE — ED Notes (Signed)
Pt complains of right hand pain x 3 weeks. Unable to open or close completely
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
## 2020-02-28 NOTE — Discharge Instructions (Signed)
We suspect that your symptoms are due to inflammation leading to bursitis or impingement syndrome. Treatment will be RICE initially, with hand surgery follow up.

## 2020-02-28 NOTE — ED Triage Notes (Signed)
Right hand pain approximately 3 weeks. Pt reports that he has been working through the pain. Unable to open or close completely.  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

## 2020-04-22 ENCOUNTER — Encounter (HOSPITAL_COMMUNITY): Payer: Self-pay | Admitting: Emergency Medicine

## 2020-04-22 ENCOUNTER — Ambulatory Visit (HOSPITAL_COMMUNITY)
Admission: EM | Admit: 2020-04-22 | Discharge: 2020-04-22 | Disposition: A | Discharge status: Home / Self Care | Payer: Commercial Managed Care - PPO | Attending: Emergency Medicine | Admitting: Emergency Medicine

## 2020-04-22 ENCOUNTER — Other Ambulatory Visit: Payer: Self-pay

## 2020-04-22 DIAGNOSIS — J45909 Unspecified asthma, uncomplicated: Secondary | ICD-10-CM

## 2020-04-22 MED ORDER — PREDNISONE 20 MG PO TABS: 40.0000 mg | ORAL_TABLET | Freq: Every day | ORAL | 0 refills | Status: DC

## 2020-04-22 MED ORDER — ALBUTEROL SULFATE HFA 108 (90 BASE) MCG/ACT IN AERS
2.0000 | INHALATION_SPRAY | Freq: Once | RESPIRATORY_TRACT | Status: AC
  Administered 2020-04-22: 2 via RESPIRATORY_TRACT

## 2020-04-22 MED ORDER — ALBUTEROL SULFATE (2.5 MG/3ML) 0.083% IN NEBU: 2.5000 mg | INHALATION_SOLUTION | Freq: Four times a day (QID) | RESPIRATORY_TRACT | 2 refills | Status: DC | PRN

## 2020-04-22 MED ORDER — ALBUTEROL SULFATE HFA 108 (90 BASE) MCG/ACT IN AERS
1.0000 | INHALATION_SPRAY | Freq: Four times a day (QID) | RESPIRATORY_TRACT | 3 refills | Status: DC | PRN
Start: 2020-04-22 — End: 2020-06-02

## 2020-04-22 MED ORDER — ALBUTEROL SULFATE HFA 108 (90 BASE) MCG/ACT IN AERS
INHALATION_SPRAY | RESPIRATORY_TRACT | Status: AC
  Filled 2020-04-22: qty 6.7

## 2020-04-22 NOTE — ED Triage Notes (Signed)
symptoms for 4 days.  Cough with production of brown mucus.  Patient complains of headache.  Denies fever.  Patient has asthma and feels his asthma is acting up

## 2020-04-22 NOTE — Discharge Instructions (Addendum)
Take 2 pills every day for the next 5 days   When having wheezing on difficulty breathing use inhaler or nebulizer solution for relief  Continue use of allergy medication  Follow up with primary care doctor if still have symptoms after medication completed
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
## 2020-06-02 ENCOUNTER — Ambulatory Visit (INDEPENDENT_AMBULATORY_CARE_PROVIDER_SITE_OTHER): Payer: Commercial Managed Care - PPO

## 2020-06-02 ENCOUNTER — Encounter (HOSPITAL_COMMUNITY): Payer: Self-pay

## 2020-06-02 ENCOUNTER — Other Ambulatory Visit: Payer: Self-pay

## 2020-06-02 ENCOUNTER — Ambulatory Visit (HOSPITAL_COMMUNITY)
Admission: EM | Admit: 2020-06-02 | Discharge: 2020-06-02 | Disposition: A | Discharge status: Home / Self Care | Payer: Commercial Managed Care - PPO | Attending: Internal Medicine | Admitting: Internal Medicine

## 2020-06-02 DIAGNOSIS — R0602 Shortness of breath: Secondary | ICD-10-CM

## 2020-06-02 DIAGNOSIS — R059 Cough, unspecified: Secondary | ICD-10-CM | POA: Diagnosis not present

## 2020-06-02 DIAGNOSIS — J4531 Mild persistent asthma with (acute) exacerbation: Secondary | ICD-10-CM

## 2020-06-02 MED ORDER — PREDNISONE 20 MG PO TABS: 40.0000 mg | ORAL_TABLET | Freq: Every day | ORAL | 0 refills | Status: AC

## 2020-06-02 MED ORDER — BENZONATATE 100 MG PO CAPS: 100.0000 mg | ORAL_CAPSULE | Freq: Three times a day (TID) | ORAL | 0 refills | Status: DC

## 2020-06-02 MED ORDER — GUAIFENESIN ER 600 MG PO TB12: 600.0000 mg | ORAL_TABLET | Freq: Two times a day (BID) | ORAL | 0 refills | Status: AC

## 2020-06-02 MED ORDER — FLUTICASONE-SALMETEROL 250-50 MCG/ACT IN AEPB: 1.0000 | INHALATION_SPRAY | Freq: Two times a day (BID) | RESPIRATORY_TRACT | 1 refills | Status: DC

## 2020-06-02 MED ORDER — ALBUTEROL SULFATE HFA 108 (90 BASE) MCG/ACT IN AERS
1.0000 | INHALATION_SPRAY | Freq: Four times a day (QID) | RESPIRATORY_TRACT | 0 refills | Status: DC | PRN
Start: 2020-06-02 — End: 2020-10-21
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
## 2020-06-02 NOTE — ED Triage Notes (Signed)
Pt presents with productive cough, congestion, and shortness of breath X 5 days.

## 2020-06-02 NOTE — Discharge Instructions (Addendum)
Use your inhaler as prescribed Use a mask at work with help with his symptoms Take medications as prescribed If symptoms worsen please return to urgent care to be reevaluated.

## 2020-06-19 ENCOUNTER — Emergency Department (HOSPITAL_COMMUNITY)
Admission: EM | Admit: 2020-06-19 | Discharge: 2020-06-19 | Disposition: A | Discharge status: Home / Self Care | Payer: Commercial Managed Care - PPO | Attending: Emergency Medicine | Admitting: Emergency Medicine

## 2020-06-19 ENCOUNTER — Encounter (HOSPITAL_COMMUNITY): Payer: Self-pay | Admitting: Emergency Medicine

## 2020-06-19 ENCOUNTER — Emergency Department (HOSPITAL_COMMUNITY): Payer: Commercial Managed Care - PPO

## 2020-06-19 ENCOUNTER — Other Ambulatory Visit: Payer: Self-pay

## 2020-06-19 DIAGNOSIS — F17210 Nicotine dependence, cigarettes, uncomplicated: Secondary | ICD-10-CM | POA: Insufficient documentation

## 2020-06-19 DIAGNOSIS — R062 Wheezing: Secondary | ICD-10-CM | POA: Diagnosis not present

## 2020-06-19 DIAGNOSIS — J441 Chronic obstructive pulmonary disease with (acute) exacerbation: Secondary | ICD-10-CM | POA: Diagnosis not present

## 2020-06-19 DIAGNOSIS — Z7951 Long term (current) use of inhaled steroids: Secondary | ICD-10-CM | POA: Insufficient documentation

## 2020-06-19 DIAGNOSIS — R0789 Other chest pain: Secondary | ICD-10-CM | POA: Diagnosis not present

## 2020-06-19 MED ORDER — ALBUTEROL SULFATE HFA 108 (90 BASE) MCG/ACT IN AERS
1.0000 | INHALATION_SPRAY | Freq: Four times a day (QID) | RESPIRATORY_TRACT | 6 refills | Status: DC | PRN
Start: 2020-06-19 — End: 2020-10-21

## 2020-06-19 MED ORDER — METHYLPREDNISOLONE SODIUM SUCC 125 MG IJ SOLR
125.0000 mg | Freq: Once | INTRAMUSCULAR | Status: AC
  Administered 2020-06-19: 11:00:00 125 mg via INTRAVENOUS
  Filled 2020-06-19: qty 2

## 2020-06-19 MED ORDER — ALBUTEROL SULFATE HFA 108 (90 BASE) MCG/ACT IN AERS: 2.0000 | INHALATION_SPRAY | RESPIRATORY_TRACT | Status: DC | PRN

## 2020-06-19 MED ORDER — IPRATROPIUM-ALBUTEROL 0.5-2.5 (3) MG/3ML IN SOLN: 3.0000 mL | Freq: Four times a day (QID) | RESPIRATORY_TRACT | 0 refills | Status: DC | PRN

## 2020-06-19 MED ORDER — IPRATROPIUM-ALBUTEROL 0.5-2.5 (3) MG/3ML IN SOLN
3.0000 mL | Freq: Once | RESPIRATORY_TRACT | Status: AC
  Administered 2020-06-19: 11:00:00 3 mL via RESPIRATORY_TRACT
  Filled 2020-06-19: qty 3

## 2020-06-19 MED ORDER — ALBUTEROL SULFATE HFA 108 (90 BASE) MCG/ACT IN AERS
1.0000 | INHALATION_SPRAY | Freq: Four times a day (QID) | RESPIRATORY_TRACT | 12 refills | Status: DC | PRN
Start: 2020-06-19 — End: 2020-10-21

## 2020-06-19 MED ORDER — PREDNISONE 10 MG PO TABS: 50.0000 mg | ORAL_TABLET | Freq: Every day | ORAL | 0 refills | Status: AC

## 2020-06-19 MED ORDER — ALBUTEROL SULFATE HFA 108 (90 BASE) MCG/ACT IN AERS
2.0000 | INHALATION_SPRAY | Freq: Once | RESPIRATORY_TRACT | Status: AC
  Administered 2020-06-19: 12:00:00 2 via RESPIRATORY_TRACT
  Filled 2020-06-19: qty 6.7

## 2020-06-19 NOTE — Discharge Instructions (Signed)
Use albuterol as needed as prescribed as your rescue medication.  Use DuoNeb every 6 hours as needed as prescribed. Follow-up with your primary care provider for recheck. Prednisone as prescribed starting tomorrow.

## 2020-06-19 NOTE — ED Triage Notes (Signed)
Patient here for shortness of breath since two weeks ago, seen at PCP yesterday and was prescribed cough medicine and doxycycline. Patient here because symptoms have no improved. Patient alert, oriented, and in no apparent distress at this time.
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
## 2020-10-21 ENCOUNTER — Ambulatory Visit (HOSPITAL_COMMUNITY)
Admission: EM | Admit: 2020-10-21 | Discharge: 2020-10-21 | Disposition: A | Discharge status: Home / Self Care | Payer: Commercial Managed Care - PPO | Attending: Student | Admitting: Student

## 2020-10-21 ENCOUNTER — Encounter (HOSPITAL_COMMUNITY): Payer: Self-pay | Admitting: Emergency Medicine

## 2020-10-21 ENCOUNTER — Other Ambulatory Visit: Payer: Self-pay

## 2020-10-21 DIAGNOSIS — Z760 Encounter for issue of repeat prescription: Secondary | ICD-10-CM

## 2020-10-21 DIAGNOSIS — J4541 Moderate persistent asthma with (acute) exacerbation: Secondary | ICD-10-CM

## 2020-10-21 DIAGNOSIS — J301 Allergic rhinitis due to pollen: Secondary | ICD-10-CM | POA: Diagnosis not present

## 2020-10-21 MED ORDER — ALBUTEROL SULFATE HFA 108 (90 BASE) MCG/ACT IN AERS
1.0000 | INHALATION_SPRAY | Freq: Once | RESPIRATORY_TRACT | Status: AC
  Administered 2020-10-21: 1 via RESPIRATORY_TRACT

## 2020-10-21 MED ORDER — FLUTICASONE-SALMETEROL 250-50 MCG/ACT IN AEPB: 1.0000 | INHALATION_SPRAY | Freq: Two times a day (BID) | RESPIRATORY_TRACT | 1 refills | Status: DC

## 2020-10-21 MED ORDER — ALBUTEROL SULFATE HFA 108 (90 BASE) MCG/ACT IN AERS
INHALATION_SPRAY | RESPIRATORY_TRACT | Status: AC
  Filled 2020-10-21: qty 6.7
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
## 2020-10-21 NOTE — Discharge Instructions (Signed)
-  Continue Advair inhaler daily and albuterol inhaler as needed -Take your allergy medication daily

## 2020-10-21 NOTE — ED Triage Notes (Signed)
2 days ago started having issues with asthma.  Audible wheezing, cough, congestion.  Patient is out of inhalers

## 2020-11-10 ENCOUNTER — Other Ambulatory Visit: Payer: Self-pay

## 2020-11-10 ENCOUNTER — Emergency Department (HOSPITAL_COMMUNITY): Payer: Commercial Managed Care - PPO

## 2020-11-10 ENCOUNTER — Encounter (HOSPITAL_COMMUNITY): Payer: Self-pay | Admitting: Emergency Medicine

## 2020-11-10 ENCOUNTER — Emergency Department (HOSPITAL_COMMUNITY)
Admission: EM | Admit: 2020-11-10 | Discharge: 2020-11-10 | Disposition: A | Discharge status: Home / Self Care | Payer: Commercial Managed Care - PPO | Attending: Emergency Medicine | Admitting: Emergency Medicine

## 2020-11-10 DIAGNOSIS — R059 Cough, unspecified: Secondary | ICD-10-CM | POA: Insufficient documentation

## 2020-11-10 DIAGNOSIS — J45909 Unspecified asthma, uncomplicated: Secondary | ICD-10-CM | POA: Diagnosis not present

## 2020-11-10 DIAGNOSIS — Z5321 Procedure and treatment not carried out due to patient leaving prior to being seen by health care provider: Secondary | ICD-10-CM | POA: Diagnosis not present

## 2020-11-10 DIAGNOSIS — R0602 Shortness of breath: Secondary | ICD-10-CM | POA: Insufficient documentation

## 2020-11-10 DIAGNOSIS — R062 Wheezing: Secondary | ICD-10-CM | POA: Insufficient documentation

## 2020-11-10 MED ORDER — ALBUTEROL SULFATE (2.5 MG/3ML) 0.083% IN NEBU
5.0000 mg | INHALATION_SOLUTION | Freq: Once | RESPIRATORY_TRACT | Status: AC
  Administered 2020-11-10: 5 mg via RESPIRATORY_TRACT
  Filled 2020-11-10: qty 6

## 2020-11-10 MED ORDER — METHYLPREDNISOLONE SODIUM SUCC 125 MG IJ SOLR: 125.0000 mg | Freq: Once | INTRAMUSCULAR | Status: DC

## 2020-11-10 NOTE — ED Provider Notes (Signed)
Emergency Medicine Provider Triage Evaluation Note  Stephen Miller , a 55 y.o. male  was evaluated in triage.  Pt complains of ongoing wheezing and shortness of breath onset approximately 3 days ago.  Patient reports using his albuterol MDI and nebulizer at home without relief.  Reports he has previously been hospitalized for his asthma but has never had to be intubated.  Patient states nebulizer helps for very short period of time and then his symptoms return.  Denies fevers or chills, nausea or vomiting.  Is vaccinated for COVID.  Has not had any sick contacts.  Review of Systems  Positive: Cough, shortness of breath, wheezing Negative: Fever, chills  Physical Exam  BP (!) 146/109   Pulse 84   Temp 97.6 F (36.4 C) (Oral)   Resp 18   Ht 5\' 11"  (1.803 m)   Wt 115 kg   SpO2 95%   BMI 35.36 kg/m  Gen:   Awake, no distress   Resp:  Inspiratory and expiratory wheezing throughout, mild increased work of breathing MSK:   Moves extremities without difficulty  Other:  No Peripheral edema  Medical Decision Making  Medically screening exam initiated at 3:42 AM.  Appropriate orders placed.  Karsyn Rochin was informed that the remainder of the evaluation will be completed by another provider, this initial triage assessment does not replace that evaluation, and the importance of remaining in the ED until their evaluation is complete.  Asthma exacerbation   Loni Muse Gwenlyn Perking 11/10/20 0431    Orpah Greek, MD 11/10/20 [phone]

## 2020-11-10 NOTE — ED Triage Notes (Signed)
Patient reports asthma attack 2 days ago with persistent wheezing/SOB and productive cough unrelieved by inhaler.

## 2020-11-10 NOTE — ED Notes (Signed)
Pt left AMA due to wait 

## 2020-12-29 ENCOUNTER — Ambulatory Visit (HOSPITAL_COMMUNITY)
Admission: EM | Admit: 2020-12-29 | Discharge: 2020-12-29 | Disposition: A | Discharge status: Home / Self Care | Payer: Commercial Managed Care - PPO | Attending: Family Medicine | Admitting: Family Medicine

## 2020-12-29 ENCOUNTER — Encounter (HOSPITAL_COMMUNITY): Payer: Self-pay

## 2020-12-29 ENCOUNTER — Ambulatory Visit (INDEPENDENT_AMBULATORY_CARE_PROVIDER_SITE_OTHER): Payer: Commercial Managed Care - PPO

## 2020-12-29 ENCOUNTER — Other Ambulatory Visit: Payer: Self-pay

## 2020-12-29 DIAGNOSIS — J4521 Mild intermittent asthma with (acute) exacerbation: Secondary | ICD-10-CM

## 2020-12-29 DIAGNOSIS — R059 Cough, unspecified: Secondary | ICD-10-CM

## 2020-12-29 DIAGNOSIS — R0602 Shortness of breath: Secondary | ICD-10-CM | POA: Diagnosis not present

## 2020-12-29 MED ORDER — ALBUTEROL SULFATE HFA 108 (90 BASE) MCG/ACT IN AERS
INHALATION_SPRAY | RESPIRATORY_TRACT | Status: AC
  Filled 2020-12-29: qty 6.7

## 2020-12-29 MED ORDER — PROMETHAZINE-DM 6.25-15 MG/5ML PO SYRP: 5.0000 mL | ORAL_SOLUTION | Freq: Four times a day (QID) | ORAL | 0 refills | Status: DC | PRN

## 2020-12-29 MED ORDER — PREDNISONE 20 MG PO TABS: 40.0000 mg | ORAL_TABLET | Freq: Every day | ORAL | 0 refills | Status: DC

## 2020-12-29 MED ORDER — ALBUTEROL SULFATE HFA 108 (90 BASE) MCG/ACT IN AERS
2.0000 | INHALATION_SPRAY | Freq: Once | RESPIRATORY_TRACT | Status: AC
  Administered 2020-12-29: 11:00:00 2 via RESPIRATORY_TRACT

## 2020-12-29 NOTE — ED Triage Notes (Signed)
Pt states that he has had a productive cough of brownish sputum, shortness of breath and headache. Pt has an inhaler bust states that it has very little medication left in it. Pt has audible wheezes.
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
## 2021-01-28 ENCOUNTER — Other Ambulatory Visit: Payer: Self-pay

## 2021-01-28 ENCOUNTER — Ambulatory Visit (HOSPITAL_COMMUNITY)
Admission: EM | Admit: 2021-01-28 | Discharge: 2021-01-28 | Disposition: A | Discharge status: Home / Self Care | Payer: Commercial Managed Care - PPO | Attending: Emergency Medicine | Admitting: Emergency Medicine

## 2021-01-28 ENCOUNTER — Encounter (HOSPITAL_COMMUNITY): Payer: Self-pay

## 2021-01-28 DIAGNOSIS — J45901 Unspecified asthma with (acute) exacerbation: Secondary | ICD-10-CM | POA: Diagnosis not present

## 2021-01-28 MED ORDER — ALBUTEROL SULFATE (2.5 MG/3ML) 0.083% IN NEBU
INHALATION_SOLUTION | RESPIRATORY_TRACT | Status: AC
  Filled 2021-01-28: qty 3

## 2021-01-28 MED ORDER — ALBUTEROL SULFATE HFA 108 (90 BASE) MCG/ACT IN AERS
1.0000 | INHALATION_SPRAY | Freq: Once | RESPIRATORY_TRACT | Status: AC
  Administered 2021-01-28: 1 via RESPIRATORY_TRACT

## 2021-01-28 MED ORDER — ALBUTEROL SULFATE HFA 108 (90 BASE) MCG/ACT IN AERS
INHALATION_SPRAY | RESPIRATORY_TRACT | Status: AC
  Filled 2021-01-28: qty 6.7

## 2021-01-28 MED ORDER — ALBUTEROL SULFATE (2.5 MG/3ML) 0.083% IN NEBU
2.5000 mg | INHALATION_SOLUTION | Freq: Once | RESPIRATORY_TRACT | Status: AC
  Administered 2021-01-28: 2.5 mg via RESPIRATORY_TRACT

## 2021-01-28 MED ORDER — METHYLPREDNISOLONE SODIUM SUCC 125 MG IJ SOLR
125.0000 mg | Freq: Once | INTRAMUSCULAR | Status: AC
  Administered 2021-01-28: 125 mg via INTRAMUSCULAR

## 2021-01-28 MED ORDER — METHYLPREDNISOLONE SODIUM SUCC 125 MG IJ SOLR
INTRAMUSCULAR | Status: AC
  Filled 2021-01-28: qty 2

## 2021-01-28 NOTE — Discharge Instructions (Signed)
Please get an updated COVID booster shot. [facility] offers them as do most big pharmacy chains ([facility])  Follow up with Dr. Alroy Dust. You may need your asthma prevention medicine changed/increased to try to prevent these flares from happening.

## 2021-01-28 NOTE — ED Triage Notes (Signed)
Pt presents with chest pain when coughing. States he was here a month ago for the same thing and states the job he works at is causing a cough and asthma flare up.

## 2021-01-29 ENCOUNTER — Other Ambulatory Visit: Payer: Self-pay

## 2021-01-29 ENCOUNTER — Emergency Department (HOSPITAL_COMMUNITY): Payer: Commercial Managed Care - PPO

## 2021-01-29 ENCOUNTER — Encounter (HOSPITAL_COMMUNITY): Payer: Self-pay

## 2021-01-29 ENCOUNTER — Observation Stay (HOSPITAL_COMMUNITY)
Admission: EM | Admit: 2021-01-29 | Discharge: 2021-01-31 | Disposition: A | Discharge status: Home / Self Care | Payer: Commercial Managed Care - PPO | Attending: Internal Medicine | Admitting: Internal Medicine

## 2021-01-29 DIAGNOSIS — Z79899 Other long term (current) drug therapy: Secondary | ICD-10-CM | POA: Diagnosis not present

## 2021-01-29 DIAGNOSIS — E876 Hypokalemia: Secondary | ICD-10-CM | POA: Insufficient documentation

## 2021-01-29 DIAGNOSIS — J111 Influenza due to unidentified influenza virus with other respiratory manifestations: Secondary | ICD-10-CM

## 2021-01-29 DIAGNOSIS — J9601 Acute respiratory failure with hypoxia: Secondary | ICD-10-CM | POA: Diagnosis not present

## 2021-01-29 DIAGNOSIS — J101 Influenza due to other identified influenza virus with other respiratory manifestations: Principal | ICD-10-CM | POA: Diagnosis present

## 2021-01-29 DIAGNOSIS — J449 Chronic obstructive pulmonary disease, unspecified: Secondary | ICD-10-CM | POA: Insufficient documentation

## 2021-01-29 DIAGNOSIS — J45901 Unspecified asthma with (acute) exacerbation: Secondary | ICD-10-CM | POA: Diagnosis not present

## 2021-01-29 DIAGNOSIS — R0602 Shortness of breath: Secondary | ICD-10-CM | POA: Diagnosis present

## 2021-01-29 DIAGNOSIS — Z20822 Contact with and (suspected) exposure to covid-19: Secondary | ICD-10-CM | POA: Insufficient documentation

## 2021-01-29 DIAGNOSIS — J4521 Mild intermittent asthma with (acute) exacerbation: Secondary | ICD-10-CM

## 2021-01-29 DIAGNOSIS — I10 Essential (primary) hypertension: Secondary | ICD-10-CM | POA: Diagnosis present

## 2021-01-29 DIAGNOSIS — F17210 Nicotine dependence, cigarettes, uncomplicated: Secondary | ICD-10-CM | POA: Insufficient documentation

## 2021-01-29 DIAGNOSIS — R0902 Hypoxemia: Secondary | ICD-10-CM

## 2021-01-29 LAB — CBC WITH DIFFERENTIAL/PLATELET
Abs Immature Granulocytes: 0.03 10*3/uL (ref 0.00–0.07)
Basophils Absolute: 0 10*3/uL (ref 0.0–0.1)
Basophils Relative: 0 %
Eosinophils Absolute: 0 10*3/uL (ref 0.0–0.5)
Eosinophils Relative: 0 %
HCT: 41.9 % (ref 39.0–52.0)
Hemoglobin: 14.6 g/dL (ref 13.0–17.0)
Immature Granulocytes: 0 %
Lymphocytes Relative: 5 %
Lymphs Abs: 0.4 10*3/uL — ABNORMAL LOW (ref 0.7–4.0)
MCH: 31.2 pg (ref 26.0–34.0)
MCHC: 34.8 g/dL (ref 30.0–36.0)
MCV: 89.5 fL (ref 80.0–100.0)
Monocytes Absolute: 1 10*3/uL (ref 0.1–1.0)
Monocytes Relative: 11 %
Neutro Abs: 7.4 10*3/uL (ref 1.7–7.7)
Neutrophils Relative %: 84 %
Platelets: 223 10*3/uL (ref 150–400)
RBC: 4.68 MIL/uL (ref 4.22–5.81)
RDW: 12.9 % (ref 11.5–15.5)
WBC: 8.9 10*3/uL (ref 4.0–10.5)
nRBC: 0 % (ref 0.0–0.2)

## 2021-01-29 LAB — RESP PANEL BY RT-PCR (FLU A&B, COVID) ARPGX2
Influenza A by PCR: POSITIVE — AB
Influenza B by PCR: NEGATIVE
SARS Coronavirus 2 by RT PCR: NEGATIVE

## 2021-01-29 LAB — HEPATIC FUNCTION PANEL
ALT: 37 U/L (ref 0–44)
AST: 46 U/L — ABNORMAL HIGH (ref 15–41)
Albumin: 4.4 g/dL (ref 3.5–5.0)
Alkaline Phosphatase: 53 U/L (ref 38–126)
Bilirubin, Direct: 0.1 mg/dL (ref 0.0–0.2)
Indirect Bilirubin: 0.5 mg/dL (ref 0.3–0.9)
Total Bilirubin: 0.6 mg/dL (ref 0.3–1.2)
Total Protein: 7.5 g/dL (ref 6.5–8.1)

## 2021-01-29 LAB — BASIC METABOLIC PANEL
Anion gap: 16 — ABNORMAL HIGH (ref 5–15)
BUN: 22 mg/dL — ABNORMAL HIGH (ref 6–20)
CO2: 24 mmol/L (ref 22–32)
Calcium: 9.3 mg/dL (ref 8.9–10.3)
Chloride: 95 mmol/L — ABNORMAL LOW (ref 98–111)
Creatinine, Ser: 1.51 mg/dL — ABNORMAL HIGH (ref 0.61–1.24)
GFR, Estimated: 55 mL/min — ABNORMAL LOW (ref >60)
Glucose, Bld: 100 mg/dL — ABNORMAL HIGH (ref 70–99)
Potassium: 3.3 mmol/L — ABNORMAL LOW (ref 3.5–5.1)
Sodium: 135 mmol/L (ref 135–145)

## 2021-01-29 LAB — LACTIC ACID, PLASMA: Lactic Acid, Venous: 1.3 mmol/L (ref 0.5–1.9)

## 2021-01-29 LAB — TROPONIN I (HIGH SENSITIVITY)
Troponin I (High Sensitivity): 16 ng/L (ref <18)
Troponin I (High Sensitivity): 21 ng/L — ABNORMAL HIGH (ref <18)

## 2021-01-29 MED ORDER — POTASSIUM CHLORIDE CRYS ER 20 MEQ PO TBCR
40.0000 meq | EXTENDED_RELEASE_TABLET | Freq: Once | ORAL | Status: AC
  Administered 2021-01-29: 40 meq via ORAL
  Filled 2021-01-29: qty 2

## 2021-01-29 MED ORDER — SENNOSIDES-DOCUSATE SODIUM 8.6-50 MG PO TABS
1.0000 | ORAL_TABLET | Freq: Every evening | ORAL | Status: DC | PRN
  Administered 2021-01-30: 1 via ORAL
  Filled 2021-01-29: qty 1

## 2021-01-29 MED ORDER — ONDANSETRON HCL 4 MG/2ML IJ SOLN: 4.0000 mg | Freq: Four times a day (QID) | INTRAMUSCULAR | Status: DC | PRN

## 2021-01-29 MED ORDER — IPRATROPIUM-ALBUTEROL 0.5-2.5 (3) MG/3ML IN SOLN
3.0000 mL | Freq: Once | RESPIRATORY_TRACT | Status: AC
  Administered 2021-01-29: 3 mL via RESPIRATORY_TRACT
  Filled 2021-01-29: qty 3

## 2021-01-29 MED ORDER — ACETAMINOPHEN 500 MG PO TABS
1000.0000 mg | ORAL_TABLET | Freq: Four times a day (QID) | ORAL | Status: DC
  Administered 2021-01-29 – 2021-01-31 (×6): 1000 mg via ORAL
  Filled 2021-01-29 (×6): qty 2

## 2021-01-29 MED ORDER — PREDNISONE 20 MG PO TABS
40.0000 mg | ORAL_TABLET | Freq: Every day | ORAL | Status: DC
  Administered 2021-01-30 – 2021-01-31 (×2): 40 mg via ORAL
  Filled 2021-01-29 (×2): qty 2

## 2021-01-29 MED ORDER — MAGNESIUM SULFATE 2 GM/50ML IV SOLN
2.0000 g | Freq: Once | INTRAVENOUS | Status: AC
  Administered 2021-01-29: 2 g via INTRAVENOUS
  Filled 2021-01-29: qty 50

## 2021-01-29 MED ORDER — HYDROCODONE BIT-HOMATROP MBR 5-1.5 MG/5ML PO SOLN
5.0000 mL | Freq: Four times a day (QID) | ORAL | Status: DC | PRN
  Administered 2021-01-29 – 2021-01-31 (×5): 5 mL via ORAL
  Filled 2021-01-29 (×7): qty 5

## 2021-01-29 MED ORDER — SODIUM CHLORIDE 0.9 % IV BOLUS
1000.0000 mL | Freq: Once | INTRAVENOUS | Status: AC
  Administered 2021-01-29: 1000 mL via INTRAVENOUS

## 2021-01-29 MED ORDER — LACTATED RINGERS IV SOLN: INTRAVENOUS | Status: AC

## 2021-01-29 MED ORDER — MELATONIN 5 MG PO TABS
10.0000 mg | ORAL_TABLET | Freq: Every evening | ORAL | Status: DC | PRN
  Administered 2021-01-30 (×2): 10 mg via ORAL
  Filled 2021-01-29 (×2): qty 2

## 2021-01-29 MED ORDER — ACETAMINOPHEN 500 MG PO TABS
1000.0000 mg | ORAL_TABLET | Freq: Once | ORAL | Status: AC
  Administered 2021-01-29: 1000 mg via ORAL
  Filled 2021-01-29: qty 2

## 2021-01-29 MED ORDER — ONDANSETRON HCL 4 MG PO TABS: 4.0000 mg | ORAL_TABLET | Freq: Four times a day (QID) | ORAL | Status: DC | PRN

## 2021-01-29 MED ORDER — IPRATROPIUM-ALBUTEROL 0.5-2.5 (3) MG/3ML IN SOLN
3.0000 mL | Freq: Four times a day (QID) | RESPIRATORY_TRACT | Status: DC
  Administered 2021-01-29 – 2021-01-30 (×4): 3 mL via RESPIRATORY_TRACT
  Filled 2021-01-29 (×4): qty 3

## 2021-01-29 MED ORDER — METHYLPREDNISOLONE SODIUM SUCC 125 MG IJ SOLR
125.0000 mg | Freq: Once | INTRAMUSCULAR | Status: AC
  Administered 2021-01-29: 125 mg via INTRAVENOUS
  Filled 2021-01-29: qty 2

## 2021-01-29 NOTE — ED Notes (Signed)
Pt sats in triage 88-90% on RA

## 2021-01-29 NOTE — Assessment & Plan Note (Addendum)
Hold HCTZ due to some mild elevated creatinine.

## 2021-01-29 NOTE — Subjective & Objective (Signed)
CC: SOB HPI: 56 year old African-American male with a history of hypertension, asthma presents to the ER today with 1 week history of fever, shortness of breath, cough.  Patient seen in urgent care yesterday.  Was given a shot of IM steroids.  Patient dates that he is continue to feel worse.  He is having increasing shortness of breath, cough.  He has had 2 to 3 days of diarrhea.  Patient states that he had his influenza vaccine approximately 2 months ago.  On presentation ER, patient noted to be hypoxic with room air saturations of 88%.  Work-up in the ER demonstrated COVID-negative, influenza positive.  Chest x-ray is negative.  To the patient's continued hypoxia, [facility] hospitalist contacted for admission.

## 2021-01-29 NOTE — ED Provider Notes (Signed)
[facility] EMERGENCY DEPARTMENT Provider Note   CSN: [id number] Arrival date & time: 01/29/21  1518     History  Chief Complaint  Patient presents with   Shortness of Breath    Stephen Miller is a 56 y.o. male.  The history is provided by the patient, the spouse and medical records. No language interpreter was used.  Shortness of Breath Severity:  Severe Onset quality:  Gradual Duration:  1 week Timing:  Constant Progression:  Waxing and waning Chronicity:  New Context: URI   Relieved by:  Nothing Worsened by:  Nothing Ineffective treatments:  None tried Associated symptoms: chest pain, cough, fever, sputum production and wheezing   Associated symptoms: no abdominal pain, no diaphoresis, no headaches, no hemoptysis, no rash and no vomiting   Risk factors: no hx of PE/DVT       Home Medications Prior to Admission medications   Medication Sig Start Date End Date Taking? Authorizing Provider  albuterol (PROVENTIL) (2.5 MG/3ML) 0.083% nebulizer solution Take 2.5 mg by nebulization 3 (three) times daily.   Yes [provider]  albuterol (VENTOLIN HFA) 108 (90 Base) MCG/ACT inhaler Inhale 2 puffs into the lungs every 4 (four) hours as needed for wheezing or shortness of breath.   Yes [provider]  BAYER LOW DOSE 81 MG EC tablet Take 81 mg by mouth daily. Swallow whole.   Yes [provider]  hydrochlorothiazide (HYDRODIURIL) 25 MG tablet Take 25 mg by mouth daily.   Yes [provider]  promethazine-dextromethorphan (PROMETHAZINE-DM) 6.25-15 MG/5ML syrup Take 5 mLs by mouth 4 (four) times daily as needed for cough. 12/29/20  Yes Hagler, Aaron Edelman, MD  ipratropium-albuterol (DUONEB) 0.5-2.5 (3) MG/3ML SOLN Take 3 mLs by nebulization every 6 (six) hours as needed. Patient not taking: Reported on 01/29/2021 06/19/20   Suella Broad A, PA-C  predniSONE (DELTASONE) 20 MG tablet Take 2 tablets (40 mg total) by mouth daily. Patient  not taking: Reported on 01/29/2021 12/29/20   Vanessa Kick, MD  budesonide-formoterol [facility]) 80-4.5 MCG/ACT inhaler Inhale 2 puffs into the lungs 2 (two) times daily. Patient not taking: Reported on 10/24/2017 08/28/16 08/21/19  Burnard Hawthorne, FNP  omeprazole (PRILOSEC) 40 MG capsule Take 1 capsule (40 mg total) by mouth 2 (two) times daily before a meal. Patient not taking: Reported on 10/24/2017 10/11/16 08/21/19  Wynona Luna, MD  pantoprazole (PROTONIX) 40 MG tablet Take 1 tablet (40 mg total) by mouth daily. Take medicine 30 minutes before breakfast Patient not taking: Reported on 10/24/2017 04/20/16 08/21/19  Multani, Bhupinder, NP      Allergies    Ivp dye [iodinated contrast media], Ketorolac tromethamine, Other, and Tramadol    Review of Systems   Review of Systems  Constitutional:  Positive for chills, fatigue and fever. Negative for diaphoresis.  HENT:  Positive for congestion.   Respiratory:  Positive for cough, sputum production, chest tightness, shortness of breath and wheezing. Negative for hemoptysis and stridor.   Cardiovascular:  Positive for chest pain. Negative for palpitations and leg swelling.  Gastrointestinal:  Positive for diarrhea. Negative for abdominal pain, constipation, nausea and vomiting.  Genitourinary:  Negative for flank pain.  Musculoskeletal:  Negative for back pain.  Skin:  Negative for rash and wound.  Neurological:  Negative for dizziness, light-headedness and headaches.  Psychiatric/Behavioral:  Negative for agitation and confusion.   All other systems reviewed and are negative.  Physical Exam Updated Vital Signs BP 136/90 (BP Location: Left Arm)  Pulse (!) 125    Temp (!) 102.3 F (39.1 C) (Oral)    Resp (!) 26    Ht 5\' 11"  (1.803 m)    Wt 102.5 kg    SpO2 90%    BMI 31.52 kg/m  Physical Exam Vitals and nursing note reviewed.  Constitutional:      General: He is not in acute distress.    Appearance: He is well-developed. He is  ill-appearing. He is not toxic-appearing or diaphoretic.  HENT:     Head: Normocephalic and atraumatic.     Mouth/Throat:     Pharynx: No pharyngeal swelling or oropharyngeal exudate.  Eyes:     Extraocular Movements: Extraocular movements intact.     Conjunctiva/sclera: Conjunctivae normal.     Pupils: Pupils are equal, round, and reactive to light.  Cardiovascular:     Rate and Rhythm: Regular rhythm. Tachycardia present.     Pulses: Normal pulses.     Heart sounds: No murmur heard. Pulmonary:     Effort: Pulmonary effort is normal. Tachypnea present. No respiratory distress.     Breath sounds: Wheezing and rhonchi present. No rales.  Chest:     Chest wall: No tenderness or edema.  Abdominal:     Palpations: Abdomen is soft.     Tenderness: There is no abdominal tenderness.  Musculoskeletal:        General: No swelling.     Cervical back: Neck supple.     Right lower leg: No tenderness. No edema.     Left lower leg: No tenderness. No edema.  Skin:    General: Skin is warm and dry.     Capillary Refill: Capillary refill takes less than 2 seconds.     Findings: No erythema.  Neurological:     General: No focal deficit present.     Mental Status: He is alert.  Psychiatric:        Mood and Affect: Mood normal. Mood is not anxious.    ED Results / Procedures / Treatments   Labs (all labs ordered are listed, but only abnormal results are displayed) Labs Reviewed  RESP PANEL BY RT-PCR (FLU A&B, COVID) ARPGX2 - Abnormal; Notable for the following components:      Result Value   Influenza A by PCR POSITIVE (*)    All other components within normal limits  BASIC METABOLIC PANEL - Abnormal; Notable for the following components:   Potassium 3.3 (*)    Chloride 95 (*)    Glucose, Bld 100 (*)    BUN 22 (*)    Creatinine, Ser 1.51 (*)    GFR, Estimated 55 (*)    Anion gap 16 (*)    All other components within normal limits  CBC WITH DIFFERENTIAL/PLATELET - Abnormal; Notable  for the following components:   Lymphs Abs 0.4 (*)    All other components within normal limits  HEPATIC FUNCTION PANEL - Abnormal; Notable for the following components:   AST 46 (*)    All other components within normal limits  TROPONIN I (HIGH SENSITIVITY) - Abnormal; Notable for the following components:   Troponin I (High Sensitivity) 21 (*)    All other components within normal limits  CULTURE, BLOOD (ROUTINE X 2)  CULTURE, BLOOD (ROUTINE X 2)  LACTIC ACID, PLASMA  CBC WITH DIFFERENTIAL/PLATELET  MAGNESIUM  HIV ANTIBODY (ROUTINE TESTING W REFLEX)  TROPONIN I (HIGH SENSITIVITY)    EKG EKG Interpretation  Date/Time:  Thursday January 29 2021 19:15:02 EST Ventricular  Rate:  108 PR Interval:  137 QRS Duration: 87 QT Interval:  322 QTC Calculation: 432 R Axis:   54 Text Interpretation: Sinus tachycardia When compared to prior, faster rate. no STEMI Confirmed by Antony Blackbird [phone]) on 01/29/2021 7:54:49 PM  Radiology DG Chest Portable 1 View  Result Date: 01/29/2021 CLINICAL DATA:  Fever, chills, short of breath for 1 week, hypoxia EXAM: PORTABLE CHEST 1 VIEW COMPARISON:  12/29/2020 FINDINGS: 2 frontal views of the chest demonstrate an unremarkable cardiac silhouette. No airspace disease, effusion, or pneumothorax. No acute bony abnormalities. IMPRESSION: 1. No acute intrathoracic process. Electronically Signed   By: Randa Ngo M.D.   On: 01/29/2021 18:32    Procedures Procedures    CRITICAL CARE Performed by: Gwenyth Allegra Alizandra Loh Total critical care time: 35 minutes Critical care time was exclusive of separately billable procedures and treating other patients. Critical care was necessary to treat or prevent imminent or life-threatening deterioration. Critical care was time spent personally by me on the following activities: development of treatment plan with patient and/or surrogate as well as nursing, discussions with consultants, evaluation of patient's response  to treatment, examination of patient, obtaining history from patient or surrogate, ordering and performing treatments and interventions, ordering and review of laboratory studies, ordering and review of radiographic studies, pulse oximetry and re-evaluation of patient's condition.   Medications Ordered in ED Medications  ipratropium-albuterol (DUONEB) 0.5-2.5 (3) MG/3ML nebulizer solution 3 mL (has no administration in time range)  magnesium sulfate IVPB 2 g 50 mL (has no administration in time range)  acetaminophen (TYLENOL) tablet 1,000 mg (has no administration in time range)  methylPREDNISolone sodium succinate (SOLU-MEDROL) 125 mg/2 mL injection 125 mg (has no administration in time range)    ED Course/ Medical Decision Making/ A&P                           Medical Decision Making Amount and/or Complexity of Data Reviewed Labs: ordered. Radiology: ordered.  Risk Decision regarding hospitalization.   Antonius Hartlage is a 56 y.o. male with a past medical history significant for COPD, asthma, GERD, and previous bronchitis who presents with 1 week of worsening shortness of breath cough productive cough, chest tightness.  He reports that when he coughs and takes deep breath he does have some discomfort in his chest but he is also had fevers and chills.  He reports he is coughing up yellow phlegm but no hemoptysis.  He reports the chest pain is more of a tightness when he takes deep breaths and with he is coughing.  He denies leg pain or leg swelling and has no history of DVT or PE.  He reports no nausea, vomiting, constipation, or urinary changes but he does report some diarrhea.  Denies any sick contacts to his knowledge.  He does not take oxygen at home but was found of oxygen saturations in the upper 80s and was placed on 2 L nasal cannula.  His vital signs are concerning for infectious process with tachycardia, tachypnea, fever, and hypoxia.  Patient was seen in triage and due to the  wheezing was given magnesium, Solu-Medrol, and DuoNeb treatment.  He had other work-up and lab started as well  On arrival to the exam room, patient remains on 4 L nasal cannula to maintain oxygen saturations.  Lungs had some wheezing and rhonchi.  We will order another DuoNeb.  Abdomen was nontender and chest was nontender.  Good pulses in extremities.  Legs are nontender and nonedematous.    EKG showed no stemi.  Clinically I suspect patient has either COVID/flu, or pneumonia versus viral URI exacerbating his asthma/COPD.  His diarrhea with it does suggest possible viral cause of symptoms, will wait for swabs to return.  Given his new oxygen requirement, I do feel he will need admission as his symptoms are still persistent despite initial breathing treatments, steroids, and magnesium.  Anticipate admission after work-up is completed.  8:41 PM Flu test returned positive.  COVID-negative.  X-ray does not show pneumonia.  Given the work-up I do still feel he needs admission for his COPD/asthma exacerbation likely triggered by his influenza diagnosis.  Will call medicine for admission given his new oxygen requirement.          Final Clinical Impression(s) / ED Diagnoses Final diagnoses:  Hypoxia  SOB (shortness of breath)  Influenza     Clinical Impression: 1. Hypoxia   2. SOB (shortness of breath)   3. Influenza     Disposition: Admit  This note was prepared with assistance of Dragon voice recognition software. Occasional wrong-word or sound-a-like substitutions may have occurred due to the inherent limitations of voice recognition software.     Monte Bronder, Gwenyth Allegra, MD 01/30/21 [phone]

## 2021-01-29 NOTE — ED Provider Triage Note (Signed)
Emergency Medicine Provider Triage Evaluation Note  Stephen Miller , a 56 y.o. male  was evaluated in triage.  Pt complains of Shortness of breath.  This is been going on for the last 3 days, was seen in urgent care yesterday and given albuterol and a shot of Solu-Medrol.  With seen by primary who told him to go to the ED due to respiratory distress.  He is having chest pain, productive cough x1 week.  Fevers at home.  Hypoxic at 86% in the waiting room.  Review of Systems  Positive: Shortness of breath, chest pain Negative: Syncope   Physical Exam  BP 136/90 (BP Location: Left Arm)    Pulse (!) 125    Temp (!) 102.3 F (39.1 C) (Oral)    Resp (!) 26    SpO2 90%  Gen:   Patient is in acute distress, tripoding Resp:  Tachypneic, diffuse expiratory and inspiratory wheezing. MSK:   Moves extremities without difficulty  Other:    Medical Decision Making  Medically screening exam initiated at 4:24 PM.  Appropriate orders placed.  Stephen Miller was informed that the remainder of the evaluation will be completed by another provider, this initial triage assessment does not replace that evaluation, and the importance of remaining in the ED until their evaluation is complete.  Patient put on supplemental air secondary to hypoxia.  Could be due to worsening asthma exacerbation, patient needs room for additional evaluation.  Given chest pain I did order an EKG and troponin, will wait till patient is in room before obtaining x-ray secondary to his respiratory distress.  PE is a consideration given the new hypoxia, patient needs a more thorough work-up and back   Sherrill Raring, [place] 01/29/21 1627

## 2021-01-29 NOTE — Assessment & Plan Note (Signed)
Continue supplemental oxygen.  Patient does not use supplemental oxygen at home.

## 2021-01-29 NOTE — ED Notes (Signed)
Patient given [place] sandwich and sprite
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
## 2021-01-29 NOTE — ED Triage Notes (Addendum)
Pt arrived POV from home c/o SHOB x1 week. Pt's O2 was 89-90% in triage. Pt placed on 2L [place]. Pt also states his chest hurts when he coughs and takes a deep breath.

## 2021-01-29 NOTE — Assessment & Plan Note (Signed)
Admit to observation med/surg bed.  Symptoms have been present for at least 1 week, tamiflu not indicated. Continue with supportive care. Scheduled tylenol.

## 2021-01-29 NOTE — ED Notes (Addendum)
Pt placed on 3lpm [place]

## 2021-01-29 NOTE — Assessment & Plan Note (Signed)
Continue with prednisone 40 mg daily.  Continue with duo nebs.  Patient does have a nebulizer machine at home.

## 2021-01-30 DIAGNOSIS — J101 Influenza due to other identified influenza virus with other respiratory manifestations: Secondary | ICD-10-CM | POA: Diagnosis not present

## 2021-01-30 LAB — CBC WITH DIFFERENTIAL/PLATELET
Abs Immature Granulocytes: 0.03 10*3/uL (ref 0.00–0.07)
Basophils Absolute: 0 10*3/uL (ref 0.0–0.1)
Basophils Relative: 0 %
Eosinophils Absolute: 0 10*3/uL (ref 0.0–0.5)
Eosinophils Relative: 0 %
HCT: 41.8 % (ref 39.0–52.0)
Hemoglobin: 14.2 g/dL (ref 13.0–17.0)
Immature Granulocytes: 1 %
Lymphocytes Relative: 4 %
Lymphs Abs: 0.3 10*3/uL — ABNORMAL LOW (ref 0.7–4.0)
MCH: 31.6 pg (ref 26.0–34.0)
MCHC: 34 g/dL (ref 30.0–36.0)
MCV: 92.9 fL (ref 80.0–100.0)
Monocytes Absolute: 0.4 10*3/uL (ref 0.1–1.0)
Monocytes Relative: 6 %
Neutro Abs: 5.9 10*3/uL (ref 1.7–7.7)
Neutrophils Relative %: 89 %
Platelets: 209 10*3/uL (ref 150–400)
RBC: 4.5 MIL/uL (ref 4.22–5.81)
RDW: 13.3 % (ref 11.5–15.5)
WBC: 6.6 10*3/uL (ref 4.0–10.5)
nRBC: 0 % (ref 0.0–0.2)

## 2021-01-30 LAB — HIV ANTIBODY (ROUTINE TESTING W REFLEX): HIV Screen 4th Generation wRfx: NONREACTIVE

## 2021-01-30 LAB — COMPREHENSIVE METABOLIC PANEL
ALT: 36 U/L (ref 0–44)
AST: 67 U/L — ABNORMAL HIGH (ref 15–41)
Albumin: 3.5 g/dL (ref 3.5–5.0)
Alkaline Phosphatase: 47 U/L (ref 38–126)
Anion gap: 7 (ref 5–15)
BUN: 19 mg/dL (ref 6–20)
CO2: 26 mmol/L (ref 22–32)
Calcium: 8.4 mg/dL — ABNORMAL LOW (ref 8.9–10.3)
Chloride: 102 mmol/L (ref 98–111)
Creatinine, Ser: 1.24 mg/dL (ref 0.61–1.24)
GFR, Estimated: >60 mL/min (ref >60)
Glucose, Bld: 162 mg/dL — ABNORMAL HIGH (ref 70–99)
Potassium: 3.7 mmol/L (ref 3.5–5.1)
Sodium: 135 mmol/L (ref 135–145)
Total Bilirubin: 0.2 mg/dL — ABNORMAL LOW (ref 0.3–1.2)
Total Protein: 6.1 g/dL — ABNORMAL LOW (ref 6.5–8.1)

## 2021-01-30 LAB — MAGNESIUM: Magnesium: 2.5 mg/dL — ABNORMAL HIGH (ref 1.7–2.4)

## 2021-01-30 MED ORDER — ENOXAPARIN SODIUM 60 MG/0.6ML IJ SOSY
50.0000 mg | PREFILLED_SYRINGE | INTRAMUSCULAR | Status: DC
  Administered 2021-01-31: 50 mg via SUBCUTANEOUS
  Filled 2021-01-30: qty 0.6

## 2021-01-30 MED ORDER — IPRATROPIUM-ALBUTEROL 0.5-2.5 (3) MG/3ML IN SOLN
3.0000 mL | Freq: Four times a day (QID) | RESPIRATORY_TRACT | Status: DC
  Administered 2021-01-31 (×2): 3 mL via RESPIRATORY_TRACT
  Filled 2021-01-30 (×3): qty 3

## 2021-01-30 MED ORDER — PANTOPRAZOLE SODIUM 20 MG PO TBEC
20.0000 mg | DELAYED_RELEASE_TABLET | Freq: Every day | ORAL | Status: DC
  Administered 2021-01-30 – 2021-01-31 (×2): 20 mg via ORAL
  Filled 2021-01-30 (×2): qty 1

## 2021-01-30 NOTE — ED Notes (Signed)
Pt asleep in room.

## 2021-01-30 NOTE — Progress Notes (Signed)
Patient arrived to unit and appears calm and free of pain. Patient assessed. Patient O2 at 6L at this time. Patient removed nasal canula; patient states he only needs it during times of exacerbation. Patient complains of throat irritation; tylenol declined. MD notified. Will continue to monitor

## 2021-01-30 NOTE — Progress Notes (Signed)
PROGRESS NOTE    Stephen Miller  FUX:[id number] DOB: 1965/11/15 DOA: 01/29/2021 PCP: [facility]    Brief Narrative:  56 year old African-American male with a history of hypertension, asthma presents to the ER today with 1 week history of fever, shortness of breath, cough.  Patient seen in urgent care yesterday.  Was given a shot of IM steroids.  Patient dates that he is continue to feel worse.  He is having increasing shortness of breath, cough.  He has had 2 to 3 days of diarrhea.  Patient states that he had his influenza vaccine approximately 2 months ago.  On presentation ER, patient noted to be hypoxic with room air saturations of 88%.   Work-up in the ER demonstrated COVID-negative, influenza positive.  Chest x-ray is negative.  1/20 still with coughing and sob. No cp  Consultants:    Procedures:   Antimicrobials:     Subjective: No dizziness. Trying to eat . No n/v  Objective: Vitals:   01/30/21 1500 01/30/21 1600 01/30/21 1621 01/30/21 2016  BP: 118/84 (!) 128/57 (!) 141/72 128/68  Pulse: 84 71 65 84  Resp: 20 20 20 18   Temp:    98.1 F (36.7 C)  TempSrc:    Oral  SpO2: 96% 98% 97% 96%  Weight:      Height:        Intake/Output Summary (Last 24 hours) at 01/30/2021 2032 Last data filed at 01/30/2021 0751 Gross per 24 hour  Intake 1277.3 ml  Output 2550 ml  Net -1272.7 ml   Filed Weights   01/29/21 1629  Weight: 102.5 kg    Examination:  General exam: Appears calm and comfortable  Respiratory system: decrease bs, no wheezing Cardiovascular system: S1 & S2 heard, RRR. No JVD, murmurs, rubs, gallops or clicks.  Gastrointestinal system: Abdomen is nondistended, soft and nontender. Normal bowel sounds heard. Central nervous system: Alert and oriented. No focal neurological deficits. Extremities: no edema Psychiatry: Judgement and insight appear normal. Mood & affect appropriate.     Data Reviewed: I have personally reviewed  following labs and imaging studies  CBC: Recent Labs  Lab 01/29/21 1628 01/30/21 0321  WBC 8.9 6.6  NEUTROABS 7.4 5.9  HGB 14.6 14.2  HCT 41.9 41.8  MCV 89.5 92.9  PLT 223 025   Basic Metabolic Panel: Recent Labs  Lab 01/29/21 1628 01/30/21 0321 01/30/21 1529  NA 135  --  135  K 3.3*  --  3.7  CL 95*  --  102  CO2 24  --  26  GLUCOSE 100*  --  162*  BUN 22*  --  19  CREATININE 1.51*  --  1.24  CALCIUM 9.3  --  8.4*  MG  --  2.5*  --    GFR: Estimated Creatinine Clearance: 84 mL/min (by C-G formula based on SCr of 1.24 mg/dL). Liver Function Tests: Recent Labs  Lab 01/29/21 1610 01/30/21 1529  AST 46* 67*  ALT 37 36  ALKPHOS 53 47  BILITOT 0.6 0.2*  PROT 7.5 6.1*  ALBUMIN 4.4 3.5   No results for input(s): LIPASE, AMYLASE in the last 168 hours. No results for input(s): AMMONIA in the last 168 hours. Coagulation Profile: No results for input(s): INR, PROTIME in the last 168 hours. Cardiac Enzymes: No results for input(s): CKTOTAL, CKMB, CKMBINDEX, TROPONINI in the last 168 hours. BNP (last 3 results) No results for input(s): PROBNP in the last 8760 hours. HbA1C: No results for input(s): HGBA1C in  the last 72 hours. CBG: No results for input(s): GLUCAP in the last 168 hours. Lipid Profile: No results for input(s): CHOL, HDL, LDLCALC, TRIG, CHOLHDL, LDLDIRECT in the last 72 hours. Thyroid Function Tests: No results for input(s): TSH, T4TOTAL, FREET4, T3FREE, THYROIDAB in the last 72 hours. Anemia Panel: No results for input(s): VITAMINB12, FOLATE, FERRITIN, TIBC, IRON, RETICCTPCT in the last 72 hours. Sepsis Labs: Recent Labs  Lab 01/29/21 1847  LATICACIDVEN 1.3    Recent Results (from the past 240 hour(s))  Resp Panel by RT-PCR (Flu A&B, Covid) Nasopharyngeal Swab     Status: Abnormal   Collection Time: 01/29/21  4:21 PM   Specimen: Nasopharyngeal Swab; Nasopharyngeal(NP) swabs in vial transport medium  Result Value Ref Range Status   SARS  Coronavirus 2 by RT PCR NEGATIVE NEGATIVE Final    Comment: (NOTE) SARS-CoV-2 target nucleic acids are NOT DETECTED.  The SARS-CoV-2 RNA is generally detectable in upper respiratory specimens during the acute phase of infection. The lowest concentration of SARS-CoV-2 viral copies this assay can detect is 138 copies/mL. A negative result does not preclude SARS-Cov-2 infection and should not be used as the sole basis for treatment or other patient management decisions. A negative result may occur with  improper specimen collection/handling, submission of specimen other than nasopharyngeal swab, presence of viral mutation(s) within the areas targeted by this assay, and inadequate number of viral copies(<138 copies/mL). A negative result must be combined with clinical observations, patient history, and epidemiological information. The expected result is Negative.  Fact Sheet for Patients:  EntrepreneurPulse.com.au  Fact Sheet for Healthcare Providers:  IncredibleEmployment.be  This test is no t yet approved or cleared by the [place] FDA and  has been authorized for detection and/or diagnosis of SARS-CoV-2 by FDA under an Emergency Use Authorization (EUA). This EUA will remain  in effect (meaning this test can be used) for the duration of the COVID-19 declaration under Section 564(b)(1) of the Act, 21 U.S.C.section 360bbb-3(b)(1), unless the authorization is terminated  or revoked sooner.       Influenza A by PCR POSITIVE (A) NEGATIVE Final   Influenza B by PCR NEGATIVE NEGATIVE Final    Comment: (NOTE) The Xpert Xpress SARS-CoV-2/FLU/RSV plus assay is intended as an aid in the diagnosis of influenza from Nasopharyngeal swab specimens and should not be used as a sole basis for treatment. Nasal washings and aspirates are unacceptable for Xpert Xpress SARS-CoV-2/FLU/RSV testing.  Fact Sheet for  Patients: EntrepreneurPulse.com.au  Fact Sheet for Healthcare Providers: IncredibleEmployment.be  This test is not yet approved or cleared by the [place] FDA and has been authorized for detection and/or diagnosis of SARS-CoV-2 by FDA under an Emergency Use Authorization (EUA). This EUA will remain in effect (meaning this test can be used) for the duration of the COVID-19 declaration under Section 564(b)(1) of the Act, 21 U.S.C. section 360bbb-3(b)(1), unless the authorization is terminated or revoked.  Performed at [facility], [place] [address], [place], [place] [address]   Blood culture (routine x 2)     Status: None (Preliminary result)   Collection Time: 01/29/21  6:47 PM   Specimen: BLOOD  Result Value Ref Range Status   Specimen Description BLOOD RIGHT ANTECUBITAL  Final   Special Requests   Final    BOTTLES DRAWN AEROBIC AND ANAEROBIC Blood Culture results may not be optimal due to an excessive volume of blood received in culture bottles   Culture   Final    NO GROWTH <  24 HOURS Performed at [facility], [place] [address] [place], [place] [address]    Report Status PENDING  Incomplete         Radiology Studies: DG Chest Portable 1 View  Result Date: 01/29/2021 CLINICAL DATA:  Fever, chills, short of breath for 1 week, hypoxia EXAM: PORTABLE CHEST 1 VIEW COMPARISON:  12/29/2020 FINDINGS: 2 frontal views of the chest demonstrate an unremarkable cardiac silhouette. No airspace disease, effusion, or pneumothorax. No acute bony abnormalities. IMPRESSION: 1. No acute intrathoracic process. Electronically Signed   By: Randa Ngo M.D.   On: 01/29/2021 18:32        Scheduled Meds:  acetaminophen  1,000 mg Oral Q6H   ipratropium-albuterol  3 mL Nebulization Q6H   predniSONE  40 mg Oral Q breakfast   Continuous Infusions:  Assessment & Plan:   Principal Problem:   Influenza A Active Problems:   Asthma  exacerbation   Benign essential hypertension   Acute respiratory failure with hypoxia (HCC)   Influenza A Supportive care as sx started >72hrs and Tamiflu has no role at this time   Acute respiratory failure with hypoxia (HCC) Due to above Continue steroid Ppi for gi ppx Keep 02 sat >92% wean 02 as toletated    Asthma exacerbation Continue prednisone, duoneb     Benign essential hypertension HCTZ held due to AKI and hypokalemia  Hypokalemia Replace and monitor  AKI Likely prerenal , improved.       DVT prophylaxis: lovenox Code Status:full Family Communication: none at bedside Disposition Plan:  Status is: Observation  The patient remains OBS appropriate and will d/c before 2 midnights.           LOS: 0 days   Time spent: 45 min with >50% on coc    Nolberto Hanlon, MD [facility] Pager [phone]  If 7PM-7AM, please contact night-coverage 01/30/2021, 8:32 PM

## 2021-01-30 NOTE — Plan of Care (Signed)

## 2021-01-31 DIAGNOSIS — J101 Influenza due to other identified influenza virus with other respiratory manifestations: Secondary | ICD-10-CM | POA: Diagnosis not present

## 2021-01-31 MED ORDER — PREDNISONE 20 MG PO TABS: 20.0000 mg | ORAL_TABLET | Freq: Every day | ORAL | 0 refills | Status: AC

## 2021-01-31 MED ORDER — AMLODIPINE BESYLATE 5 MG PO TABS: 5.0000 mg | ORAL_TABLET | Freq: Every day | ORAL | Status: DC

## 2021-01-31 MED ORDER — AMLODIPINE BESYLATE 5 MG PO TABS: 5.0000 mg | ORAL_TABLET | Freq: Every day | ORAL | 0 refills | Status: DC

## 2021-01-31 MED ORDER — PANTOPRAZOLE SODIUM 20 MG PO TBEC: 20.0000 mg | DELAYED_RELEASE_TABLET | Freq: Every day | ORAL | 0 refills | Status: DC

## 2021-01-31 MED ORDER — GUAIFENESIN ER 600 MG PO TB12
600.0000 mg | ORAL_TABLET | Freq: Two times a day (BID) | ORAL | Status: DC
  Administered 2021-01-31: 600 mg via ORAL
  Filled 2021-01-31: qty 1

## 2021-01-31 MED ORDER — GUAIFENESIN ER 600 MG PO TB12: 600.0000 mg | ORAL_TABLET | Freq: Two times a day (BID) | ORAL | 0 refills | Status: AC

## 2021-01-31 MED ORDER — GUAIFENESIN-DM 100-10 MG/5ML PO SYRP: 5.0000 mL | ORAL_SOLUTION | ORAL | Status: DC | PRN

## 2021-01-31 NOTE — Discharge Summary (Signed)
Stephen Miller TDH:[id number] DOB: [birth year] DOA: 01/29/2021  PCP: [facility]  Admit date: 01/29/2021 Discharge date: 01/31/2021  Admitted From: Home: Disposition: Home  Recommendations for Outpatient Follow-up:  Follow up with PCP in 1 week Please obtain BMP/CBC in one week     Discharge Condition:Stable CODE STATUS: Full Diet recommendation: Heart Healthy  Brief/Interim Summary: Per HPI:56 year old African-American male with a history of hypertension, asthma presents to the ER today with 1 week history of fever, shortness of breath, cough.  Patient seen in urgent care yesterday.  Was given a shot of IM steroids.  Patient dates that he is continue to feel worse.  He is having increasing shortness of breath, cough.  He has had 2 to 3 days of diarrhea.  Patient states that he had his influenza vaccine approximately 2 months ago.   On presentation ER, patient noted to be hypoxic with room air saturations of 88%.   Work-up in the ER demonstrated COVID-negative, influenza positive.  Chest x-ray is negative   Influenza A Supportive care as sx started >72hrs and Tamiflu had no role  Clinically improving and feels better.      Acute respiratory failure with hypoxia (HCC) Due to above Continue steroid taper Ppi for gi ppx On RA now 02 sat 92 and above      Asthma exacerbation Continue prednisone and inhalers at home       Benign essential hypertension HCTZ discontinued due to AKI and hypokalemia Started on amlodipine 5 mg daily Follow-up with PCP for further management   Hypokalemia Replaced and stable   AKI Likely prerenal , improved.       Discharge Diagnoses:  Principal Problem:   Influenza A Active Problems:   Asthma exacerbation   Benign essential hypertension   Acute respiratory failure with hypoxia [facility])    Discharge Instructions  Discharge Instructions     Call MD for:  difficulty breathing, headache or visual  disturbances   Complete by: As directed    Diet - low sodium heart healthy   Complete by: As directed    Discharge instructions   Complete by: As directed    Follow up with pcp next week Keep hydrated Start prednisone tomorrow   Increase activity slowly   Complete by: As directed       Allergies as of 01/31/2021       Reactions   Ivp Dye [iodinated Contrast Media] Anaphylaxis, Shortness Of Breath, Nausea And Vomiting, Swelling   Throat became swollen   Ketorolac Tromethamine Anaphylaxis, Swelling, Other (See Comments)   Lips became swollen   Other Anaphylaxis, Swelling, Other (See Comments)   Per the patient: "Dye used during a MRI, and not iodine"   Tramadol Shortness Of Breath, Swelling, Other (See Comments)   Face and neck became swollen        Medication List     STOP taking these medications    hydrochlorothiazide 25 MG tablet Commonly known as: HYDRODIURIL       TAKE these medications    albuterol 108 (90 Base) MCG/ACT inhaler Commonly known as: VENTOLIN HFA Inhale 2 puffs into the lungs every 4 (four) hours as needed for wheezing or shortness of breath.   albuterol (2.5 MG/3ML) 0.083% nebulizer solution Commonly known as: PROVENTIL Take 2.5 mg by nebulization 3 (three) times daily.   amLODipine 5 MG tablet Commonly known as: NORVASC Take 1 tablet (5 mg total) by mouth daily.   Bayer Low Dose 81 MG EC tablet  Generic drug: aspirin Take 81 mg by mouth daily. Swallow whole.   guaiFENesin 600 MG 12 hr tablet Commonly known as: MUCINEX Take 1 tablet (600 mg total) by mouth 2 (two) times daily for 7 days.   ipratropium-albuterol 0.5-2.5 (3) MG/3ML Soln Commonly known as: DUONEB Take 3 mLs by nebulization every 6 (six) hours as needed.   pantoprazole 20 MG tablet Commonly known as: PROTONIX Take 1 tablet (20 mg total) by mouth daily. Start taking on: February 01, 2021   predniSONE 20 MG tablet Commonly known as: DELTASONE Take 1 tablet (20 mg  total) by mouth daily with breakfast for 3 days. Start taking on: February 01, 2021 What changed:  how much to take when to take this   promethazine-dextromethorphan 6.25-15 MG/5ML syrup Commonly known as: PROMETHAZINE-DM Take 5 mLs by mouth 4 (four) times daily as needed for cough.        Follow-up Information     [facility] Follow up in 1 week(s).   Contact information: [address], [address] [place] [address] [phone]                Allergies  Allergen Reactions   Ivp Dye [Iodinated Contrast Media] Anaphylaxis, Shortness Of Breath, Nausea And Vomiting and Swelling    Throat became swollen   Ketorolac Tromethamine Anaphylaxis, Swelling and Other (See Comments)    Lips became swollen   Other Anaphylaxis, Swelling and Other (See Comments)    Per the patient: "Dye used during a MRI, and not iodine"   Tramadol Shortness Of Breath, Swelling and Other (See Comments)    Face and neck became swollen    Consultations:    Procedures/Studies: DG Chest Portable 1 View  Result Date: 01/29/2021 CLINICAL DATA:  Fever, chills, short of breath for 1 week, hypoxia EXAM: PORTABLE CHEST 1 VIEW COMPARISON:  12/29/2020 FINDINGS: 2 frontal views of the chest demonstrate an unremarkable cardiac silhouette. No airspace disease, effusion, or pneumothorax. No acute bony abnormalities. IMPRESSION: 1. No acute intrathoracic process. Electronically Signed   By: Randa Ngo M.D.   On: 01/29/2021 18:32      Subjective: Feels better.  No shortness of breath.  Coughing has decreased.  On room air and no chest pain  Discharge Exam: Vitals:   01/31/21 0801 01/31/21 1121  BP: (!) 134/94   Pulse: 83   Resp: 17   Temp: 98.6 F (37 C)   SpO2: 94% 94%   Vitals:   01/31/21 0406 01/31/21 0726 01/31/21 0801 01/31/21 1121  BP:   (!) 134/94   Pulse:   83   Resp:   17   Temp:   98.6 F (37 C)   TempSrc:   Oral   SpO2:  98% 94% 94%  Weight: 102  kg     Height:        General: Pt is alert, awake, not in acute distress Cardiovascular: RRR, S1/S2 +, no rubs, no gallops Respiratory: CTA bilaterally, no wheezing, no rhonchi Abdominal: Soft, NT, ND, bowel sounds + Extremities: no edema, no cyanosis    The results of significant diagnostics from this hospitalization (including imaging, microbiology, ancillary and laboratory) are listed below for reference.     Microbiology: Recent Results (from the past 240 hour(s))  Resp Panel by RT-PCR (Flu A&B, Covid) Nasopharyngeal Swab     Status: Abnormal   Collection Time: 01/29/21  4:21 PM   Specimen: Nasopharyngeal Swab; Nasopharyngeal(NP) swabs in vial transport medium  Result Value Ref Range Status   SARS Coronavirus 2 by RT PCR NEGATIVE NEGATIVE Final    Comment: (NOTE) SARS-CoV-2 target nucleic acids are NOT DETECTED.  The SARS-CoV-2 RNA is generally detectable in upper respiratory specimens during the acute phase of infection. The lowest concentration of SARS-CoV-2 viral copies this assay can detect is 138 copies/mL. A negative result does not preclude SARS-Cov-2 infection and should not be used as the sole basis for treatment or other patient management decisions. A negative result may occur with  improper specimen collection/handling, submission of specimen other than nasopharyngeal swab, presence of viral mutation(s) within the areas targeted by this assay, and inadequate number of viral copies(<138 copies/mL). A negative result must be combined with clinical observations, patient history, and epidemiological information. The expected result is Negative.  Fact Sheet for Patients:  EntrepreneurPulse.com.au  Fact Sheet for Healthcare Providers:  IncredibleEmployment.be  This test is no t yet approved or cleared by the [place] FDA and  has been authorized for detection and/or diagnosis of SARS-CoV-2 by FDA under an Emergency Use  Authorization (EUA). This EUA will remain  in effect (meaning this test can be used) for the duration of the COVID-19 declaration under Section 564(b)(1) of the Act, 21 U.S.C.section 360bbb-3(b)(1), unless the authorization is terminated  or revoked sooner.       Influenza A by PCR POSITIVE (A) NEGATIVE Final   Influenza B by PCR NEGATIVE NEGATIVE Final    Comment: (NOTE) The Xpert Xpress SARS-CoV-2/FLU/RSV plus assay is intended as an aid in the diagnosis of influenza from Nasopharyngeal swab specimens and should not be used as a sole basis for treatment. Nasal washings and aspirates are unacceptable for Xpert Xpress SARS-CoV-2/FLU/RSV testing.  Fact Sheet for Patients: EntrepreneurPulse.com.au  Fact Sheet for Healthcare Providers: IncredibleEmployment.be  This test is not yet approved or cleared by the [place] FDA and has been authorized for detection and/or diagnosis of SARS-CoV-2 by FDA under an Emergency Use Authorization (EUA). This EUA will remain in effect (meaning this test can be used) for the duration of the COVID-19 declaration under Section 564(b)(1) of the Act, 21 U.S.C. section 360bbb-3(b)(1), unless the authorization is terminated or revoked.  Performed at [facility], [place] [address] [place], [place] [address]   Blood culture (routine x 2)     Status: None (Preliminary result)   Collection Time: 01/29/21  6:47 PM   Specimen: BLOOD  Result Value Ref Range Status   Specimen Description BLOOD RIGHT ANTECUBITAL  Final   Special Requests   Final    BOTTLES DRAWN AEROBIC AND ANAEROBIC Blood Culture results may not be optimal due to an excessive volume of blood received in culture bottles   Culture   Final    NO GROWTH 2 DAYS Performed at [facility], [place] [address], [place], [place] [address]    Report Status PENDING  Incomplete     Labs: BNP (last 3 results) No results for input(s): BNP in the last  8760 hours. Basic Metabolic Panel: Recent Labs  Lab 01/29/21 1628 01/30/21 0321 01/30/21 1529  NA 135  --  135  K 3.3*  --  3.7  CL 95*  --  102  CO2 24  --  26  GLUCOSE 100*  --  162*  BUN 22*  --  19  CREATININE 1.51*  --  1.24  CALCIUM 9.3  --  8.4*  MG  --  2.5*  --    Liver  Function Tests: Recent Labs  Lab 01/29/21 1610 01/30/21 1529  AST 46* 67*  ALT 37 36  ALKPHOS 53 47  BILITOT 0.6 0.2*  PROT 7.5 6.1*  ALBUMIN 4.4 3.5   No results for input(s): LIPASE, AMYLASE in the last 168 hours. No results for input(s): AMMONIA in the last 168 hours. CBC: Recent Labs  Lab 01/29/21 1628 01/30/21 0321  WBC 8.9 6.6  NEUTROABS 7.4 5.9  HGB 14.6 14.2  HCT 41.9 41.8  MCV 89.5 92.9  PLT 223 209   Cardiac Enzymes: No results for input(s): CKTOTAL, CKMB, CKMBINDEX, TROPONINI in the last 168 hours. BNP: Invalid input(s): POCBNP CBG: No results for input(s): GLUCAP in the last 168 hours. D-Dimer No results for input(s): DDIMER in the last 72 hours. Hgb A1c No results for input(s): HGBA1C in the last 72 hours. Lipid Profile No results for input(s): CHOL, HDL, LDLCALC, TRIG, CHOLHDL, LDLDIRECT in the last 72 hours. Thyroid function studies No results for input(s): TSH, T4TOTAL, T3FREE, THYROIDAB in the last 72 hours.  Invalid input(s): FREET3 Anemia work up No results for input(s): VITAMINB12, FOLATE, FERRITIN, TIBC, IRON, RETICCTPCT in the last 72 hours. Urinalysis    Component Value Date/Time   COLORURINE YELLOW 11/24/2015 1830   APPEARANCEUR CLEAR 11/24/2015 1830   LABSPEC 1.024 11/24/2015 1830   PHURINE 6.0 11/24/2015 1830   GLUCOSEU NEGATIVE 11/24/2015 1830   HGBUR NEGATIVE 11/24/2015 1830   BILIRUBINUR NEGATIVE 11/24/2015 1830   KETONESUR NEGATIVE 11/24/2015 1830   PROTEINUR NEGATIVE 11/24/2015 1830   NITRITE NEGATIVE 11/24/2015 1830   LEUKOCYTESUR NEGATIVE 11/24/2015 1830   Sepsis Labs Invalid input(s): PROCALCITONIN,  WBC,   LACTICIDVEN Microbiology Recent Results (from the past 240 hour(s))  Resp Panel by RT-PCR (Flu A&B, Covid) Nasopharyngeal Swab     Status: Abnormal   Collection Time: 01/29/21  4:21 PM   Specimen: Nasopharyngeal Swab; Nasopharyngeal(NP) swabs in vial transport medium  Result Value Ref Range Status   SARS Coronavirus 2 by RT PCR NEGATIVE NEGATIVE Final    Comment: (NOTE) SARS-CoV-2 target nucleic acids are NOT DETECTED.  The SARS-CoV-2 RNA is generally detectable in upper respiratory specimens during the acute phase of infection. The lowest concentration of SARS-CoV-2 viral copies this assay can detect is 138 copies/mL. A negative result does not preclude SARS-Cov-2 infection and should not be used as the sole basis for treatment or other patient management decisions. A negative result may occur with  improper specimen collection/handling, submission of specimen other than nasopharyngeal swab, presence of viral mutation(s) within the areas targeted by this assay, and inadequate number of viral copies(<138 copies/mL). A negative result must be combined with clinical observations, patient history, and epidemiological information. The expected result is Negative.  Fact Sheet for Patients:  EntrepreneurPulse.com.au  Fact Sheet for Healthcare Providers:  IncredibleEmployment.be  This test is no t yet approved or cleared by the [place] FDA and  has been authorized for detection and/or diagnosis of SARS-CoV-2 by FDA under an Emergency Use Authorization (EUA). This EUA will remain  in effect (meaning this test can be used) for the duration of the COVID-19 declaration under Section 564(b)(1) of the Act, 21 U.S.C.section 360bbb-3(b)(1), unless the authorization is terminated  or revoked sooner.       Influenza A by PCR POSITIVE (A) NEGATIVE Final   Influenza B by PCR NEGATIVE NEGATIVE Final    Comment: (NOTE) The Xpert Xpress SARS-CoV-2/FLU/RSV  plus assay is intended as an aid in the diagnosis of influenza from Nasopharyngeal  swab specimens and should not be used as a sole basis for treatment. Nasal washings and aspirates are unacceptable for Xpert Xpress SARS-CoV-2/FLU/RSV testing.  Fact Sheet for Patients: EntrepreneurPulse.com.au  Fact Sheet for Healthcare Providers: IncredibleEmployment.be  This test is not yet approved or cleared by the [place] FDA and has been authorized for detection and/or diagnosis of SARS-CoV-2 by FDA under an Emergency Use Authorization (EUA). This EUA will remain in effect (meaning this test can be used) for the duration of the COVID-19 declaration under Section 564(b)(1) of the Act, 21 U.S.C. section 360bbb-3(b)(1), unless the authorization is terminated or revoked.  Performed at [facility], [place] [address] [place], [place] [address]   Blood culture (routine x 2)     Status: None (Preliminary result)   Collection Time: 01/29/21  6:47 PM   Specimen: BLOOD  Result Value Ref Range Status   Specimen Description BLOOD RIGHT ANTECUBITAL  Final   Special Requests   Final    BOTTLES DRAWN AEROBIC AND ANAEROBIC Blood Culture results may not be optimal due to an excessive volume of blood received in culture bottles   Culture   Final    NO GROWTH 2 DAYS Performed at [facility], [place] [address] [place], [place] [address]    Report Status PENDING  Incomplete     Time coordinating discharge: Over 30 minutes  SIGNED:   Nolberto Hanlon, MD  [facility] 01/31/2021, 1:33 PM Pager   If 7PM-7AM, please contact night-coverage [email or web address] Password [email or web address]

## 2021-02-03 LAB — CULTURE, BLOOD (ROUTINE X 2): Culture: NO GROWTH

## 2021-03-04 ENCOUNTER — Institutional Professional Consult (permissible substitution): Payer: Commercial Managed Care - PPO | Admitting: Pulmonary Disease

## 2021-03-04 ENCOUNTER — Telehealth: Payer: Self-pay

## 2021-03-04 NOTE — Telephone Encounter (Signed)
ATC pt to see if he wanted to continuing to see Dr. Wandra Scot as he did non 2021 or wanted to see Dr. Lamonte Sakai which is scheduled to do tomorrow r/t Asthma referral. LVMTCB.

## 2021-03-05 ENCOUNTER — Institutional Professional Consult (permissible substitution): Payer: Commercial Managed Care - PPO | Admitting: Emergency Medicine

## 2021-03-18 ENCOUNTER — Ambulatory Visit (INDEPENDENT_AMBULATORY_CARE_PROVIDER_SITE_OTHER): Payer: Commercial Managed Care - PPO | Admitting: Internal Medicine

## 2021-03-18 ENCOUNTER — Encounter: Payer: Self-pay | Admitting: Internal Medicine

## 2021-03-18 ENCOUNTER — Other Ambulatory Visit: Payer: Self-pay

## 2021-03-18 VITALS — BP 138/88 | HR 73 | Temp 98.3°F | Ht 69.5 in | Wt 229.8 lb

## 2021-03-18 DIAGNOSIS — J449 Chronic obstructive pulmonary disease, unspecified: Secondary | ICD-10-CM | POA: Diagnosis not present

## 2021-03-18 LAB — POCT EXHALED NITRIC OXIDE: FeNO level (ppb): 50

## 2021-03-18 MED ORDER — BREZTRI AEROSPHERE 160-9-4.8 MCG/ACT IN AERO: 2.0000 | INHALATION_SPRAY | Freq: Two times a day (BID) | RESPIRATORY_TRACT | 0 refills | Status: DC

## 2021-03-18 MED ORDER — BREZTRI AEROSPHERE 160-9-4.8 MCG/ACT IN AERO: 2.0000 | INHALATION_SPRAY | Freq: Two times a day (BID) | RESPIRATORY_TRACT | 5 refills | Status: DC

## 2021-03-18 MED ORDER — MONTELUKAST SODIUM 10 MG PO TABS: 10.0000 mg | ORAL_TABLET | Freq: Every day | ORAL | 5 refills | Status: DC
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
## 2021-03-18 NOTE — Patient Instructions (Addendum)
Please schedule follow up scheduled with myself in 2 months.  If my schedule is not open yet, we will contact you with a reminder closer to that time. Please call [phone] if you haven't heard from [place] a month before.   Before your next visit I would like you to have: Blood work today.  Stop trelegy. Start breztri. 2 puffs twice a day gargle after use.   Continue albuterol as needed.   Start new allergy pill singulair.   By learning about asthma and how it can be controlled, you take an important step toward managing this disease. Work closely with your asthma care team to learn all you can about your asthma, how to avoid triggers, what your medications do, and how to take them correctly. With proper care, you can live free of asthma symptoms and maintain a normal, healthy lifestyle.   What is asthma? Asthma is a chronic disease that affects the airways of the lungs. During normal breathing, the bands of muscle that surround the airways are relaxed and air moves freely. During an asthma episode or "attack," there are three main changes that stop air from moving easily through the airways: The bands of muscle that surround the airways tighten and make the airways narrow. This tightening is called bronchospasm.  The lining of the airways becomes swollen or inflamed.  The cells that line the airways produce more mucus, which is thicker than normal and clogs the airways.  These three factors - bronchospasm, inflammation, and mucus production - cause symptoms such as difficulty breathing, wheezing, and coughing.  What are the most common symptoms of asthma? Asthma symptoms are not the same for everyone. They can even change from episode to episode in the same person. Also, you may have only one symptom of asthma, such as cough, but another person may have all the symptoms of asthma. It is important to know all the symptoms of asthma and to be aware that your asthma can present in any of these  ways at any time. The most common symptoms include: Coughing, especially at night  Shortness of breath  Wheezing  Chest tightness, pain, or pressure   Who is affected by asthma? Asthma affects 22 million Americans; about 6 million of these are children under age 57. People who have a family history of asthma have an increased risk of developing the disease. Asthma is also more common in people who have allergies or who are exposed to tobacco smoke. However, anyone can develop asthma at any time. Some people may have asthma all of their lives, while others may develop it as adults.  What causes asthma? The airways in a person with asthma are very sensitive and react to many things, or "triggers." Contact with these triggers causes asthma symptoms. One of the most important parts of asthma control is to identify your triggers and then avoid them when possible. The only trigger you do not want to avoid is exercise. Pre-treatment with medicines before exercise can allow you to stay active yet avoid asthma symptoms. Common asthma triggers include: Infections (colds, viruses, flu, sinus infections)  Exercise  Weather (changes in temperature and/or humidity, cold air)  Tobacco smoke  Allergens (dust mites, pollens, pets, mold spores, cockroaches, and sometimes foods)  Irritants (strong odors from cleaning products, perfume, wood smoke, air pollution)  Strong emotions such as crying or laughing hard  Some medications   How is asthma diagnosed? To diagnose asthma, your doctor will first review your medical  history, family history, and symptoms. Your doctor will want to know any past history of breathing problems you may have had, as well as a family history of asthma, allergies, eczema (a bumpy, itchy skin rash caused by allergies), or other lung disease. It is important that you describe your symptoms in detail (cough, wheeze, shortness of breath, chest tightness), including when and how often they  occur. The doctor will perform a physical examination and listen to your heart and lungs. He or she may also order breathing tests, allergy tests, blood tests, and chest and sinus X-rays. The tests will find out if you do have asthma and if there are any other conditions that are contributing factors.  How is asthma treated? Asthma can be controlled, but not cured. It is not normal to have frequent symptoms, trouble sleeping, or trouble completing tasks. Appropriate asthma care will prevent symptoms and visits to the emergency room and hospital. Asthma medicines are one of the mainstays of asthma treatment. The drugs used to treat asthma are explained below.  Anti-inflammatories: These are the most important drugs for most people with asthma. Anti-inflammatory drugs reduce swelling and mucus production in the airways. As a result, airways are less sensitive and less likely to react to triggers. These medications need to be taken daily and may need to be taken for several weeks before they begin to control asthma. Anti-inflammatory medicines lead to fewer symptoms, better airflow, less sensitive airways, less airway damage, and fewer asthma attacks. If taken every day, they CONTROL or prevent asthma symptoms.   Bronchodilators: These drugs relax the muscle bands that tighten around the airways. This action opens the airways, letting more air in and out of the lungs and improving breathing. Bronchodilators also help clear mucus from the lungs. As the airways open, the mucus moves more freely and can be coughed out more easily. In short-acting forms, bronchodilators RELIEVE or stop asthma symptoms by quickly opening the airways and are very helpful during an asthma episode. In long-acting forms, bronchodilators provide CONTROL of asthma symptoms and prevent asthma episodes.  Asthma drugs can be taken in a variety of ways. Inhaling the medications by using a metered dose inhaler, dry powder inhaler, or  nebulizer is one way of taking asthma medicines. Oral medicines (pills or liquids you swallow) may also be prescribed.  Asthma severity Asthma is classified as either "intermittent" (comes and goes) or "persistent" (lasting). Persistent asthma is further described as being mild, moderate, or severe. The severity of asthma is based on how often you have symptoms both during the day and night, as well as by the results of lung function tests and by how well you can perform activities. The "severity" of asthma refers to how "intense" or "strong" your asthma is.  Asthma control Asthma control is the goal of asthma treatment. Regardless of your asthma severity, it may or may not be controlled. Asthma control means: You are able to do everything you want to do at work and home  You have no (or minimal) asthma symptoms  You do not wake up from your sleep or earlier than usual in the morning due to asthma  You rarely need to use your reliever medicine (inhaler)  Another major part of your treatment is that you are happy with your asthma care and believe your asthma is controlled.  Monitoring symptoms A key part of treatment is keeping track of how well your lungs are working. Monitoring your symptoms  what they are, how  and when they happen, and how severe they are  is an important part of being able to control your asthma.  Sometimes asthma is monitored using a peak flow meter. A peak flow (PF) meter measures how fast the air comes out of your lungs. It can help you know when your asthma is getting worse, sometimes even before you have symptoms. By taking daily peak flow readings, you can learn when to adjust medications to keep asthma under good control. It is also used to create your asthma action plan (see below). Your doctor can use your peak flow readings to adjust your treatment plan in some cases.  Asthma Action Plan Based on your history and asthma severity, you and your doctor will develop  a care plan called an asthma action plan. The asthma action plan describes when and how to use your medicines, actions to take when asthma worsens, and when to seek emergency care. Make sure you understand this plan. If you do not, ask your asthma care provider any questions you may have. Your asthma action plan is one of the keys to controlling asthma. Keep it readily available to remind you of what you need to do every day to control asthma and what you need to do when symptoms occur.  Goals of asthma therapy These are the goals of asthma treatment: Live an active, normal life  Prevent chronic and troublesome symptoms  Attend work or school every day  Perform daily activities without difficulty  Stop urgent visits to the doctor, emergency department, or hospital  Use and adjust medications to control asthma with few or no side effects

## 2021-03-19 ENCOUNTER — Encounter: Payer: Self-pay | Admitting: Internal Medicine

## 2021-03-19 LAB — RESPIRATORY ALLERGY PROFILE REGION II ~~LOC~~
Allergen, A. alternata, m6: 0.58 kU/L — ABNORMAL HIGH
Allergen, Cedar tree, t12: 1.91 kU/L — ABNORMAL HIGH
Allergen, Comm Silver Birch, t9: 0.63 kU/L — ABNORMAL HIGH
Allergen, Cottonwood, t14: 1.12 kU/L — ABNORMAL HIGH
Allergen, D pternoyssinus,d7: 69.9 kU/L — ABNORMAL HIGH
Allergen, Mouse Urine Protein, e78: <0.1 kU/L
Allergen, Mulberry, t76: 0.58 kU/L — ABNORMAL HIGH
Allergen, Oak,t7: 0.86 kU/L — ABNORMAL HIGH
Allergen, P. notatum, m1: 1.03 kU/L — ABNORMAL HIGH
Aspergillus fumigatus, m3: 0.44 kU/L — ABNORMAL HIGH
Bermuda Grass: 1.54 kU/L — ABNORMAL HIGH
Box Elder IgE: 0.85 kU/L — ABNORMAL HIGH
CLADOSPORIUM HERBARUM (M2) IGE: 0.87 kU/L — ABNORMAL HIGH
COMMON RAGWEED (SHORT) (W1) IGE: 1.05 kU/L — ABNORMAL HIGH
Cat Dander: <0.1 kU/L
Class: 0
Class: 0
Class: 1
Class: 1
Class: 1
Class: 1
Class: 1
Class: 1
Class: 2
Class: 2
Class: 2
Class: 2
Class: 2
Class: 2
Class: 2
Class: 2
Class: 2
Class: 2
Class: 2
Class: 2
Class: 2
Class: 3
Class: 5
Class: 6
Cockroach: 2.41 kU/L — ABNORMAL HIGH
D. farinae: >100 kU/L — ABNORMAL HIGH
Dog Dander: 0.37 kU/L — ABNORMAL HIGH
Elm IgE: 0.84 kU/L — ABNORMAL HIGH
IgE (Immunoglobulin E), Serum: 1850 kU/L — ABNORMAL HIGH (ref <=114)
Johnson Grass: 1.2 kU/L — ABNORMAL HIGH
Pecan/Hickory Tree IgE: 0.73 kU/L — ABNORMAL HIGH
Rough Pigweed  IgE: 0.72 kU/L — ABNORMAL HIGH
Sheep Sorrel IgE: 0.69 kU/L — ABNORMAL HIGH
Timothy Grass: 6.64 kU/L — ABNORMAL HIGH

## 2021-03-19 LAB — INTERPRETATION:

## 2021-04-30 ENCOUNTER — Encounter (HOSPITAL_COMMUNITY): Payer: Self-pay | Admitting: Emergency Medicine

## 2021-04-30 ENCOUNTER — Ambulatory Visit (INDEPENDENT_AMBULATORY_CARE_PROVIDER_SITE_OTHER): Payer: Commercial Managed Care - PPO

## 2021-04-30 ENCOUNTER — Ambulatory Visit (HOSPITAL_COMMUNITY)
Admission: EM | Admit: 2021-04-30 | Discharge: 2021-04-30 | Disposition: A | Discharge status: Home / Self Care | Payer: Commercial Managed Care - PPO | Attending: Family Medicine | Admitting: Family Medicine

## 2021-04-30 DIAGNOSIS — M79642 Pain in left hand: Secondary | ICD-10-CM | POA: Diagnosis not present

## 2021-04-30 DIAGNOSIS — M25531 Pain in right wrist: Secondary | ICD-10-CM | POA: Diagnosis not present

## 2021-04-30 MED ORDER — IBUPROFEN 800 MG PO TABS: 800.0000 mg | ORAL_TABLET | Freq: Three times a day (TID) | ORAL | 0 refills | Status: DC | PRN

## 2021-04-30 NOTE — ED Triage Notes (Signed)
Pt reports after got off work around 2am was getting bags out of truck and fell. C/o hand to left wrist and hand.  ?
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
## 2021-04-30 NOTE — Discharge Instructions (Addendum)
Your x-ray did not show any broken bone ? ?Take ibuprofen 800 mg--1 tab every 8 hours as needed for pain.  ? ?Ice and elevate your hand today and tomorrow ?

## 2021-05-22 ENCOUNTER — Ambulatory Visit: Payer: Commercial Managed Care - PPO | Admitting: Internal Medicine

## 2021-06-15 ENCOUNTER — Ambulatory Visit (HOSPITAL_COMMUNITY)
Admission: EM | Admit: 2021-06-15 | Discharge: 2021-06-15 | Disposition: A | Discharge status: Home / Self Care | Payer: Commercial Managed Care - PPO

## 2021-06-15 NOTE — ED Triage Notes (Signed)
Patient called to room twice with no answer.

## 2021-08-04 ENCOUNTER — Emergency Department (HOSPITAL_BASED_OUTPATIENT_CLINIC_OR_DEPARTMENT_OTHER)
Admission: EM | Admit: 2021-08-04 | Discharge: 2021-08-04 | Disposition: A | Discharge status: Home / Self Care | Payer: Commercial Managed Care - PPO | Attending: Emergency Medicine | Admitting: Emergency Medicine

## 2021-08-04 ENCOUNTER — Emergency Department (HOSPITAL_BASED_OUTPATIENT_CLINIC_OR_DEPARTMENT_OTHER): Payer: Commercial Managed Care - PPO

## 2021-08-04 ENCOUNTER — Encounter (HOSPITAL_BASED_OUTPATIENT_CLINIC_OR_DEPARTMENT_OTHER): Payer: Self-pay | Admitting: Emergency Medicine

## 2021-08-04 ENCOUNTER — Other Ambulatory Visit: Payer: Self-pay

## 2021-08-04 DIAGNOSIS — J449 Chronic obstructive pulmonary disease, unspecified: Secondary | ICD-10-CM | POA: Insufficient documentation

## 2021-08-04 DIAGNOSIS — Z79899 Other long term (current) drug therapy: Secondary | ICD-10-CM | POA: Diagnosis not present

## 2021-08-04 DIAGNOSIS — K219 Gastro-esophageal reflux disease without esophagitis: Secondary | ICD-10-CM | POA: Diagnosis not present

## 2021-08-04 DIAGNOSIS — J45909 Unspecified asthma, uncomplicated: Secondary | ICD-10-CM | POA: Insufficient documentation

## 2021-08-04 DIAGNOSIS — Z7951 Long term (current) use of inhaled steroids: Secondary | ICD-10-CM | POA: Diagnosis not present

## 2021-08-04 DIAGNOSIS — R197 Diarrhea, unspecified: Secondary | ICD-10-CM | POA: Diagnosis not present

## 2021-08-04 DIAGNOSIS — R109 Unspecified abdominal pain: Secondary | ICD-10-CM

## 2021-08-04 LAB — URINALYSIS, ROUTINE W REFLEX MICROSCOPIC
Bilirubin Urine: NEGATIVE
Glucose, UA: NEGATIVE mg/dL
Ketones, ur: NEGATIVE mg/dL
Leukocytes,Ua: NEGATIVE
Nitrite: NEGATIVE
Protein, ur: NEGATIVE mg/dL
Specific Gravity, Urine: >=1.03 (ref 1.005–1.030)
pH: 5.5 (ref 5.0–8.0)

## 2021-08-04 LAB — COMPREHENSIVE METABOLIC PANEL
ALT: 28 U/L (ref 0–44)
AST: 28 U/L (ref 15–41)
Albumin: 4.2 g/dL (ref 3.5–5.0)
Alkaline Phosphatase: 59 U/L (ref 38–126)
Anion gap: 8 (ref 5–15)
BUN: 15 mg/dL (ref 6–20)
CO2: 25 mmol/L (ref 22–32)
Calcium: 8.7 mg/dL — ABNORMAL LOW (ref 8.9–10.3)
Chloride: 106 mmol/L (ref 98–111)
Creatinine, Ser: 1.01 mg/dL (ref 0.61–1.24)
GFR, Estimated: >60 mL/min (ref >60)
Glucose, Bld: 114 mg/dL — ABNORMAL HIGH (ref 70–99)
Potassium: 4 mmol/L (ref 3.5–5.1)
Sodium: 139 mmol/L (ref 135–145)
Total Bilirubin: 0.8 mg/dL (ref 0.3–1.2)
Total Protein: 7.6 g/dL (ref 6.5–8.1)

## 2021-08-04 LAB — CBC WITH DIFFERENTIAL/PLATELET
Abs Immature Granulocytes: 0.01 10*3/uL (ref 0.00–0.07)
Basophils Absolute: 0 10*3/uL (ref 0.0–0.1)
Basophils Relative: 1 %
Eosinophils Absolute: 0.2 10*3/uL (ref 0.0–0.5)
Eosinophils Relative: 3 %
HCT: 40.2 % (ref 39.0–52.0)
Hemoglobin: 13.9 g/dL (ref 13.0–17.0)
Immature Granulocytes: 0 %
Lymphocytes Relative: 39 %
Lymphs Abs: 2.1 10*3/uL (ref 0.7–4.0)
MCH: 30.8 pg (ref 26.0–34.0)
MCHC: 34.6 g/dL (ref 30.0–36.0)
MCV: 88.9 fL (ref 80.0–100.0)
Monocytes Absolute: 0.5 10*3/uL (ref 0.1–1.0)
Monocytes Relative: 10 %
Neutro Abs: 2.5 10*3/uL (ref 1.7–7.7)
Neutrophils Relative %: 47 %
Platelets: 221 10*3/uL (ref 150–400)
RBC: 4.52 MIL/uL (ref 4.22–5.81)
RDW: 13.5 % (ref 11.5–15.5)
WBC: 5.3 10*3/uL (ref 4.0–10.5)
nRBC: 0 % (ref 0.0–0.2)

## 2021-08-04 LAB — LIPASE, BLOOD: Lipase: 36 U/L (ref 11–51)

## 2021-08-04 LAB — URINALYSIS, MICROSCOPIC (REFLEX): Bacteria, UA: NONE SEEN

## 2021-08-04 MED ORDER — OXYCODONE-ACETAMINOPHEN 5-325 MG PO TABS
1.0000 | ORAL_TABLET | Freq: Once | ORAL | Status: AC
  Administered 2021-08-04: 1 via ORAL
  Filled 2021-08-04: qty 1

## 2021-08-04 MED ORDER — SODIUM CHLORIDE 0.9 % IV BOLUS: 1000.0000 mL | Freq: Once | INTRAVENOUS | Status: DC

## 2021-08-04 NOTE — Discharge Instructions (Signed)
You were evaluated today in the emergency department.  You had a CT stone study that did not show any acute abnormalities.  Your labs were overall unremarkable.  I recommend that you follow-up with your primary care doctor. In the meanwhile, I recommend resting, heating pads over the area that hurts, and taking over-the-counter ibuprofen for pain. You can take Imodium that is over-the-counter for your diarrhea as needed. Please return if you develop worsening abdominal pain, fevers, or symptoms that do not improve.

## 2021-08-04 NOTE — ED Notes (Signed)
Signature pad not working, pt verbalizes understanding of dc instructions
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
## 2021-08-04 NOTE — ED Triage Notes (Signed)
Right sided abdominal pain x 3 - 4 days. Mild green diarrhea  Movement worsens pain Denies urinary sx, penile discharge, n/v.

## 2021-08-04 NOTE — ED Notes (Signed)
Patient transported to CT 

## 2021-10-24 ENCOUNTER — Encounter (HOSPITAL_COMMUNITY): Payer: Self-pay

## 2021-10-24 ENCOUNTER — Ambulatory Visit (HOSPITAL_COMMUNITY)
Admission: EM | Admit: 2021-10-24 | Discharge: 2021-10-24 | Disposition: A | Discharge status: Home / Self Care | Payer: Commercial Managed Care - PPO | Attending: Emergency Medicine | Admitting: Emergency Medicine

## 2021-10-24 DIAGNOSIS — J4531 Mild persistent asthma with (acute) exacerbation: Secondary | ICD-10-CM

## 2021-10-24 DIAGNOSIS — J4489 Other specified chronic obstructive pulmonary disease: Secondary | ICD-10-CM | POA: Diagnosis not present

## 2021-10-24 MED ORDER — METHYLPREDNISOLONE SODIUM SUCC 125 MG IJ SOLR
INTRAMUSCULAR | Status: AC
  Filled 2021-10-24: qty 2

## 2021-10-24 MED ORDER — PROMETHAZINE-DM 6.25-15 MG/5ML PO SYRP: 5.0000 mL | ORAL_SOLUTION | Freq: Four times a day (QID) | ORAL | 0 refills | Status: DC | PRN

## 2021-10-24 MED ORDER — PREDNISONE 20 MG PO TABS: 40.0000 mg | ORAL_TABLET | Freq: Every day | ORAL | 0 refills | Status: DC

## 2021-10-24 MED ORDER — BENZONATATE 100 MG PO CAPS: 100.0000 mg | ORAL_CAPSULE | Freq: Three times a day (TID) | ORAL | 0 refills | Status: DC

## 2021-10-24 MED ORDER — METHYLPREDNISOLONE SODIUM SUCC 125 MG IJ SOLR
80.0000 mg | Freq: Once | INTRAMUSCULAR | Status: AC
Start: 2021-10-24 — End: 2021-10-24
  Administered 2021-10-24: 80 mg via INTRAMUSCULAR

## 2021-10-24 MED ORDER — BREZTRI AEROSPHERE 160-9-4.8 MCG/ACT IN AERO: 2.0000 | INHALATION_SPRAY | Freq: Two times a day (BID) | RESPIRATORY_TRACT | 2 refills | Status: DC

## 2021-10-24 NOTE — Discharge Instructions (Signed)
Today you are being treated for inflammation to your upper airways  You have been given an injection of steroids today in office to help calm your breathing, starting tomorrow begin use of prednisone every morning with food for 5 days to help reduce inflammation   Begin use of your daily inhaler taking 2 puffs every morning and every evening, may continue use of your albuterol rescue inhaler  Use Tessalon pill every 8 hours to help calm your coughing  May use promethazine DM for coughing and additional comfort, be mindful this medication may make you drowsy  For worsening signs of breathing please go to the nearest emergency department for evaluation  In addition:  Maintaining adequate hydration may help to thin secretions and soothe the respiratory mucosa   Warm Liquids- Ingestion of warm liquids may have a soothing effect on the respiratory mucosa, increase the flow of nasal mucus, and loosen respiratory secretions, making them easier to remove  May try honey (2.5 to 5 mL [0.5 to 1 teaspoon]) can be given straight or diluted in liquid (juice). Corn syrup may be substituted if honey is not available.

## 2021-10-24 NOTE — ED Triage Notes (Signed)
Wheezing and SOB onset a week and a half. History of asthma and COPD. Has been using nebulizer with no relief.
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
## 2022-01-18 ENCOUNTER — Encounter (HOSPITAL_COMMUNITY): Payer: Self-pay

## 2022-01-18 ENCOUNTER — Ambulatory Visit (HOSPITAL_COMMUNITY)
Admission: EM | Admit: 2022-01-18 | Discharge: 2022-01-18 | Disposition: A | Discharge status: Home / Self Care | Payer: Commercial Managed Care - PPO | Attending: Physician Assistant | Admitting: Physician Assistant

## 2022-01-18 DIAGNOSIS — Z79899 Other long term (current) drug therapy: Secondary | ICD-10-CM | POA: Insufficient documentation

## 2022-01-18 DIAGNOSIS — Z7952 Long term (current) use of systemic steroids: Secondary | ICD-10-CM | POA: Diagnosis not present

## 2022-01-18 DIAGNOSIS — U071 COVID-19: Secondary | ICD-10-CM | POA: Diagnosis not present

## 2022-01-18 DIAGNOSIS — J4521 Mild intermittent asthma with (acute) exacerbation: Secondary | ICD-10-CM | POA: Diagnosis present

## 2022-01-18 DIAGNOSIS — J069 Acute upper respiratory infection, unspecified: Secondary | ICD-10-CM | POA: Diagnosis present

## 2022-01-18 DIAGNOSIS — J4489 Other specified chronic obstructive pulmonary disease: Secondary | ICD-10-CM | POA: Diagnosis present

## 2022-01-18 LAB — POC INFLUENZA A AND B ANTIGEN (URGENT CARE ONLY)
INFLUENZA A ANTIGEN, POC: NEGATIVE
INFLUENZA B ANTIGEN, POC: NEGATIVE

## 2022-01-18 MED ORDER — BENZONATATE 100 MG PO CAPS: 100.0000 mg | ORAL_CAPSULE | Freq: Three times a day (TID) | ORAL | 0 refills | Status: DC

## 2022-01-18 MED ORDER — PREDNISONE 10 MG (21) PO TBPK: ORAL_TABLET | ORAL | 0 refills | Status: DC

## 2022-01-18 MED ORDER — BREZTRI AEROSPHERE 160-9-4.8 MCG/ACT IN AERO: 2.0000 | INHALATION_SPRAY | Freq: Two times a day (BID) | RESPIRATORY_TRACT | 2 refills | Status: DC

## 2022-01-18 MED ORDER — METHYLPREDNISOLONE SODIUM SUCC 125 MG IJ SOLR
INTRAMUSCULAR | Status: AC
  Filled 2022-01-18: qty 2

## 2022-01-18 MED ORDER — ALBUTEROL SULFATE HFA 108 (90 BASE) MCG/ACT IN AERS: 2.0000 | INHALATION_SPRAY | RESPIRATORY_TRACT | 0 refills | Status: DC | PRN

## 2022-01-18 MED ORDER — IPRATROPIUM-ALBUTEROL 0.5-2.5 (3) MG/3ML IN SOLN
3.0000 mL | Freq: Once | RESPIRATORY_TRACT | Status: AC
  Administered 2022-01-18: 3 mL via RESPIRATORY_TRACT

## 2022-01-18 MED ORDER — IPRATROPIUM-ALBUTEROL 0.5-2.5 (3) MG/3ML IN SOLN
RESPIRATORY_TRACT | Status: AC
  Filled 2022-01-18: qty 3

## 2022-01-18 MED ORDER — METHYLPREDNISOLONE SODIUM SUCC 125 MG IJ SOLR
60.0000 mg | Freq: Once | INTRAMUSCULAR | Status: AC
  Administered 2022-01-18: 125 mg via INTRAMUSCULAR

## 2022-01-18 NOTE — ED Notes (Signed)
Pt received Solu-Medrol in left Ventrogluteal pt tolerated injection well
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
## 2022-01-18 NOTE — Discharge Instructions (Signed)
You are negative for flu and we will contact you if you are positive for COVID.  Please monitor your MyChart for these results.  I have refilled your rescue and maintenance inhalers.  Use these as prescribed.  Start prednisone taper tomorrow (01/19/2022).  Do not take NSAIDs with this medication including aspirin, ibuprofen/Advil, naproxen/Aleve.  You can use acetaminophen/Tylenol for additional symptom relief.  Use Tessalon for cough.  I recommend over-the-counter medication including Mucinex and Flonase.  Make sure you are resting and drinking plenty of fluid.  If you are positive for COVID and interested in starting Paxlovid please let our nurse know.  You would need to not take tadalafil/Cialis while on this medication for 3 days after completing course as well as your amlodipine.  If you have any worsening symptoms including chest pain, shortness of breath, nausea, vomiting, weakness you need to be seen immediately.

## 2022-01-18 NOTE — ED Triage Notes (Signed)
Pt is here for cough, SOB, sore throat, nasal congestion, runny nose mostly at night. X 2days

## 2022-01-19 LAB — SARS CORONAVIRUS 2 (TAT 6-24 HRS): SARS Coronavirus 2: POSITIVE — AB

## 2022-01-20 ENCOUNTER — Telehealth (HOSPITAL_COMMUNITY): Payer: Self-pay | Admitting: Emergency Medicine

## 2022-01-20 MED ORDER — NIRMATRELVIR/RITONAVIR (PAXLOVID)TABLET: 3.0000 | ORAL_TABLET | Freq: Two times a day (BID) | ORAL | 0 refills | Status: AC

## 2022-02-24 ENCOUNTER — Ambulatory Visit (INDEPENDENT_AMBULATORY_CARE_PROVIDER_SITE_OTHER): Payer: Commercial Managed Care - PPO | Admitting: Adult Health

## 2022-02-24 ENCOUNTER — Encounter: Payer: Self-pay | Admitting: Adult Health

## 2022-02-24 ENCOUNTER — Telehealth: Payer: Self-pay | Admitting: *Deleted

## 2022-02-24 VITALS — BP 124/70 | HR 92 | Temp 98.0°F | Ht 70.0 in | Wt 232.2 lb

## 2022-02-24 DIAGNOSIS — J4550 Severe persistent asthma, uncomplicated: Secondary | ICD-10-CM

## 2022-02-24 DIAGNOSIS — J310 Chronic rhinitis: Secondary | ICD-10-CM | POA: Diagnosis not present

## 2022-02-24 DIAGNOSIS — J4489 Other specified chronic obstructive pulmonary disease: Secondary | ICD-10-CM | POA: Diagnosis not present

## 2022-02-24 DIAGNOSIS — Z720 Tobacco use: Secondary | ICD-10-CM

## 2022-02-24 NOTE — Telephone Encounter (Signed)
Called and spoke with associate with [facility], she verified that patient has not had the Symbicort or Trelegy filled at their pharmacy.  She said he does have advair on the list and the Judithann Sauger is $600.

## 2022-02-24 NOTE — Progress Notes (Unsigned)
Has had 2 covid vaccines.
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
## 2022-02-24 NOTE — Patient Instructions (Addendum)
Continue on Trelegy 1 puff daily, rinse after use.  Albuterol inhaler /neb As needed   Asthma action plan as discussed.  Zyrtec 72m At bedtime  As needed   Saline nasal rinses As needed   Follow up with Dr. DShearon Stallsin 6 months and As needed

## 2022-02-25 ENCOUNTER — Ambulatory Visit: Payer: Commercial Managed Care - PPO

## 2022-02-25 ENCOUNTER — Other Ambulatory Visit: Payer: Self-pay | Admitting: *Deleted

## 2022-02-25 ENCOUNTER — Telehealth: Payer: Self-pay | Admitting: *Deleted

## 2022-02-25 DIAGNOSIS — J310 Chronic rhinitis: Secondary | ICD-10-CM | POA: Insufficient documentation

## 2022-02-25 DIAGNOSIS — Z720 Tobacco use: Secondary | ICD-10-CM | POA: Insufficient documentation

## 2022-02-25 DIAGNOSIS — J45909 Unspecified asthma, uncomplicated: Secondary | ICD-10-CM | POA: Insufficient documentation

## 2022-02-25 DIAGNOSIS — J4489 Other specified chronic obstructive pulmonary disease: Secondary | ICD-10-CM | POA: Insufficient documentation

## 2022-02-25 NOTE — Assessment & Plan Note (Addendum)
COPD with asthma and active smoker.  Smoking cessation was discussed.  Patient is had to recent asthma flares.  Admission in October 2023 for influenza and a emergency room visit in January with asthma flare associated viral illness.  Unclear if patient is compliant with medications have encouraged him to take his inhalers every day.  Prescription for Trelegy sent to his pharmacy.  Asthma action plan discussed.  Continue with trigger prevention. Needs chest x-ray and PFTs on return visit  Plan  Patient Instructions  Continue on Trelegy 1 puff daily, rinse after use.  Albuterol inhaler /neb As needed   Asthma action plan as discussed.  Zyrtec 96m At bedtime  As needed   Saline nasal rinses As needed   Follow up with Dr. DShearon Stallsin 2-3 months with chest xray and PFT and As needed

## 2022-02-25 NOTE — Assessment & Plan Note (Signed)
Saline nasal rinses and Zyrtec daily As needed

## 2022-02-25 NOTE — Telephone Encounter (Signed)
Atc patient x1.  Left detailed message per DPR letting patient know that he needs to call back and schedule an 2-3 month f/u with Dr. Shearon Stalls and arrive 20 minutes early to get check in and get a cxr prior to appointment.  He also needs to schedule a 1 hour PFT.  Advised to call the office to schedule.  When he calls back, please schedule 2-3 month f/u with Dr. Shearon Stalls and a 1 hour PFT.  Thank you.

## 2022-02-25 NOTE — Assessment & Plan Note (Signed)
Smoking cessation discussed 

## 2022-03-05 ENCOUNTER — Encounter (HOSPITAL_COMMUNITY): Payer: Self-pay

## 2022-03-05 ENCOUNTER — Ambulatory Visit (HOSPITAL_COMMUNITY)
Admission: EM | Admit: 2022-03-05 | Discharge: 2022-03-05 | Disposition: A | Discharge status: Home / Self Care | Payer: Commercial Managed Care - PPO | Attending: Emergency Medicine | Admitting: Emergency Medicine

## 2022-03-05 ENCOUNTER — Ambulatory Visit (INDEPENDENT_AMBULATORY_CARE_PROVIDER_SITE_OTHER): Payer: Commercial Managed Care - PPO

## 2022-03-05 DIAGNOSIS — M19041 Primary osteoarthritis, right hand: Secondary | ICD-10-CM | POA: Diagnosis not present

## 2022-03-05 DIAGNOSIS — M79641 Pain in right hand: Secondary | ICD-10-CM | POA: Diagnosis not present

## 2022-03-05 MED ORDER — ACETAMINOPHEN 325 MG PO TABS
ORAL_TABLET | ORAL | Status: AC
  Filled 2022-03-05: qty 2

## 2022-03-05 MED ORDER — DICLOFENAC SODIUM 1 % EX GEL: 2.0000 g | Freq: Three times a day (TID) | CUTANEOUS | 0 refills | Status: DC

## 2022-03-05 MED ORDER — ACETAMINOPHEN 325 MG PO TABS
650.0000 mg | ORAL_TABLET | Freq: Once | ORAL | Status: AC
Start: 2022-03-05 — End: 2022-03-05
  Administered 2022-03-05: 650 mg via ORAL
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
## 2022-03-05 NOTE — ED Triage Notes (Signed)
Chief Complaint: Bilateral hand pain. No known injuries or falls. No medical history of hand or joint problems. Swelling in both wrist and thumbs, some tingling in the fingers as well.   Onset: couple months worse in the last week.   Prescriptions or OTC medications tried: Yes: Advil with no relief

## 2022-03-05 NOTE — Discharge Instructions (Addendum)
I recommend to continue tylenol for pain. You can apply ice for 20 minutes at a time, several times daily.  The Voltaren gel can be used three times daily on the wrist and thumb. This may help with pain control. Do not use this gel if you are taking ibuprofen/Advil.  Please follow up with the hand specialist for further evaluation and management

## 2022-03-24 ENCOUNTER — Other Ambulatory Visit: Payer: Self-pay

## 2022-03-24 ENCOUNTER — Ambulatory Visit (HOSPITAL_COMMUNITY)
Admission: RE | Admit: 2022-03-24 | Discharge: 2022-03-24 | Disposition: A | Discharge status: Home / Self Care | Payer: Commercial Managed Care - PPO | Source: Ambulatory Visit

## 2022-03-24 ENCOUNTER — Encounter (HOSPITAL_COMMUNITY): Payer: Self-pay

## 2022-03-24 VITALS — BP 145/66 | HR 86 | Temp 98.3°F | Resp 24

## 2022-03-24 DIAGNOSIS — J4541 Moderate persistent asthma with (acute) exacerbation: Secondary | ICD-10-CM | POA: Diagnosis not present

## 2022-03-24 MED ORDER — IPRATROPIUM-ALBUTEROL 0.5-2.5 (3) MG/3ML IN SOLN
RESPIRATORY_TRACT | Status: AC
  Filled 2022-03-24: qty 3

## 2022-03-24 MED ORDER — PREDNISONE 10 MG (21) PO TBPK: ORAL_TABLET | ORAL | 0 refills | Status: DC

## 2022-03-24 MED ORDER — METHYLPREDNISOLONE SODIUM SUCC 125 MG IJ SOLR
80.0000 mg | Freq: Once | INTRAMUSCULAR | Status: AC
  Administered 2022-03-24: 80 mg via INTRAMUSCULAR

## 2022-03-24 MED ORDER — METHYLPREDNISOLONE SODIUM SUCC 125 MG IJ SOLR
INTRAMUSCULAR | Status: AC
  Filled 2022-03-24: qty 2

## 2022-03-24 MED ORDER — IPRATROPIUM-ALBUTEROL 0.5-2.5 (3) MG/3ML IN SOLN
3.0000 mL | Freq: Once | RESPIRATORY_TRACT | Status: AC
  Administered 2022-03-24: 3 mL via RESPIRATORY_TRACT
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
## 2022-03-24 NOTE — Discharge Instructions (Signed)
We are treating you for an asthma flare.  We gave an injection of steroids.  Please start prednisone tomorrow (03/25/2022).  Do not take NSAIDs with this medication including aspirin, ibuprofen/Advil, naproxen/Aleve.  You can use acetaminophen/Tylenol.  Use your inhaler every 4-6 hours as needed.  You should start the Trelegy that you got from your pulmonologist.  If your symptoms not improving within a few days or if you have any worsening symptoms including worsening shortness of breath, recurrent wheezing despite medication, nausea/vomiting interfering with oral intake, weakness you need to be seen immediately.

## 2022-03-24 NOTE — ED Triage Notes (Signed)
Patient has sob, wheezing.  Patient has a cough.  Phlegm is yellow and brown.  Feels drainage in back of throat.  Patient has audible wheezing.
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
## 2022-04-21 ENCOUNTER — Other Ambulatory Visit: Payer: Self-pay

## 2022-04-21 ENCOUNTER — Ambulatory Visit
Payer: Commercial Managed Care - PPO | Attending: Nurse Practitioner | Admitting: Rehabilitative and Restorative Service Providers"

## 2022-04-21 ENCOUNTER — Encounter: Payer: Self-pay | Admitting: Rehabilitative and Restorative Service Providers"

## 2022-04-21 DIAGNOSIS — M79642 Pain in left hand: Secondary | ICD-10-CM | POA: Diagnosis present

## 2022-04-21 DIAGNOSIS — M25642 Stiffness of left hand, not elsewhere classified: Secondary | ICD-10-CM | POA: Diagnosis present

## 2022-04-21 DIAGNOSIS — M25641 Stiffness of right hand, not elsewhere classified: Secondary | ICD-10-CM

## 2022-04-21 DIAGNOSIS — M25631 Stiffness of right wrist, not elsewhere classified: Secondary | ICD-10-CM | POA: Diagnosis present

## 2022-04-21 DIAGNOSIS — M25632 Stiffness of left wrist, not elsewhere classified: Secondary | ICD-10-CM | POA: Diagnosis present

## 2022-04-21 DIAGNOSIS — M25531 Pain in right wrist: Secondary | ICD-10-CM | POA: Diagnosis present

## 2022-04-21 DIAGNOSIS — R278 Other lack of coordination: Secondary | ICD-10-CM | POA: Insufficient documentation

## 2022-04-21 DIAGNOSIS — M79641 Pain in right hand: Secondary | ICD-10-CM | POA: Diagnosis present

## 2022-04-26 ENCOUNTER — Ambulatory Visit: Payer: Commercial Managed Care - PPO | Admitting: Rehabilitative and Restorative Service Providers"

## 2022-04-26 ENCOUNTER — Telehealth: Payer: Self-pay | Admitting: Rehabilitative and Restorative Service Providers"

## 2022-04-26 NOTE — Telephone Encounter (Signed)
OT called patient to discuss missed (no-show) appointment. OT left message with pt about next appointment date/time. They were reminded of the attendance policy and future missed appointments could lead to early discharge from therapy.

## 2022-05-03 ENCOUNTER — Ambulatory Visit: Payer: Commercial Managed Care - PPO | Admitting: Rehabilitative and Restorative Service Providers"
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
## 2022-05-05 ENCOUNTER — Ambulatory Visit: Payer: Commercial Managed Care - PPO | Admitting: Rehabilitative and Restorative Service Providers"

## 2022-05-05 ENCOUNTER — Encounter: Payer: Self-pay | Admitting: Rehabilitative and Restorative Service Providers"

## 2022-05-05 DIAGNOSIS — M25632 Stiffness of left wrist, not elsewhere classified: Secondary | ICD-10-CM

## 2022-05-05 DIAGNOSIS — M25641 Stiffness of right hand, not elsewhere classified: Secondary | ICD-10-CM

## 2022-05-05 DIAGNOSIS — M25642 Stiffness of left hand, not elsewhere classified: Secondary | ICD-10-CM

## 2022-05-05 DIAGNOSIS — R278 Other lack of coordination: Secondary | ICD-10-CM

## 2022-05-05 DIAGNOSIS — M79642 Pain in left hand: Secondary | ICD-10-CM

## 2022-05-05 DIAGNOSIS — M79641 Pain in right hand: Secondary | ICD-10-CM | POA: Diagnosis not present

## 2022-05-05 DIAGNOSIS — M25531 Pain in right wrist: Secondary | ICD-10-CM

## 2022-05-05 DIAGNOSIS — M25631 Stiffness of right wrist, not elsewhere classified: Secondary | ICD-10-CM

## 2022-05-25 ENCOUNTER — Ambulatory Visit: Payer: Commercial Managed Care - PPO | Admitting: Occupational Therapy

## 2022-05-27 ENCOUNTER — Ambulatory Visit: Payer: Commercial Managed Care - PPO | Admitting: Occupational Therapy

## 2022-05-27 ENCOUNTER — Encounter: Payer: Self-pay | Admitting: Occupational Therapy

## 2022-05-27 NOTE — Therapy (Signed)
OCCUPATIONAL THERAPY DISCHARGE SUMMARY  Pt d/c this date per our attendance policy as he has been a no show for 4 visits.  Phone call placed and left voicemail to inform him of discharge and need for new referral should further therapy services be needed.

## 2022-06-01 ENCOUNTER — Ambulatory Visit: Payer: Commercial Managed Care - PPO | Admitting: Occupational Therapy

## 2022-06-08 ENCOUNTER — Ambulatory Visit: Payer: Commercial Managed Care - PPO | Admitting: Occupational Therapy

## 2022-06-09 ENCOUNTER — Ambulatory Visit (INDEPENDENT_AMBULATORY_CARE_PROVIDER_SITE_OTHER): Payer: Commercial Managed Care - PPO

## 2022-06-09 ENCOUNTER — Ambulatory Visit (HOSPITAL_COMMUNITY)
Admission: EM | Admit: 2022-06-09 | Discharge: 2022-06-09 | Disposition: A | Discharge status: Home / Self Care | Payer: Commercial Managed Care - PPO | Attending: Emergency Medicine | Admitting: Emergency Medicine

## 2022-06-09 ENCOUNTER — Encounter (HOSPITAL_COMMUNITY): Payer: Self-pay

## 2022-06-09 DIAGNOSIS — M19041 Primary osteoarthritis, right hand: Secondary | ICD-10-CM

## 2022-06-09 DIAGNOSIS — J4489 Other specified chronic obstructive pulmonary disease: Secondary | ICD-10-CM | POA: Diagnosis not present

## 2022-06-09 DIAGNOSIS — M19042 Primary osteoarthritis, left hand: Secondary | ICD-10-CM

## 2022-06-09 DIAGNOSIS — J4551 Severe persistent asthma with (acute) exacerbation: Secondary | ICD-10-CM

## 2022-06-09 HISTORY — DX: Essential (primary) hypertension: I10

## 2022-06-09 MED ORDER — DEXAMETHASONE SODIUM PHOSPHATE 10 MG/ML IJ SOLN
10.0000 mg | Freq: Once | INTRAMUSCULAR | Status: AC
  Administered 2022-06-09: 10 mg via INTRAMUSCULAR

## 2022-06-09 MED ORDER — ALBUTEROL SULFATE HFA 108 (90 BASE) MCG/ACT IN AERS
2.0000 | INHALATION_SPRAY | RESPIRATORY_TRACT | 0 refills | Status: DC | PRN
Start: 2022-06-09 — End: 2022-07-19

## 2022-06-09 MED ORDER — DEXAMETHASONE SODIUM PHOSPHATE 10 MG/ML IJ SOLN
INTRAMUSCULAR | Status: AC
  Filled 2022-06-09: qty 1

## 2022-06-09 MED ORDER — IPRATROPIUM-ALBUTEROL 0.5-2.5 (3) MG/3ML IN SOLN
RESPIRATORY_TRACT | Status: AC
  Filled 2022-06-09: qty 3

## 2022-06-09 MED ORDER — IPRATROPIUM-ALBUTEROL 0.5-2.5 (3) MG/3ML IN SOLN: 3.0000 mL | Freq: Four times a day (QID) | RESPIRATORY_TRACT | 0 refills | Status: DC | PRN

## 2022-06-09 MED ORDER — PREDNISONE 10 MG PO TABS: 20.0000 mg | ORAL_TABLET | Freq: Every day | ORAL | 0 refills | Status: DC

## 2022-06-09 MED ORDER — IPRATROPIUM-ALBUTEROL 0.5-2.5 (3) MG/3ML IN SOLN
3.0000 mL | Freq: Once | RESPIRATORY_TRACT | Status: AC
  Administered 2022-06-09: 3 mL via RESPIRATORY_TRACT

## 2022-06-09 NOTE — Discharge Instructions (Addendum)
The steroids will help with your asthma as well as your pain and inflammation for your hands.  It is important that you follow-up with an orthopedic for further evaluation and potential alternate treatment therapies.  I suggest wearing the wrist brace at night.  Can take Tylenol as needed for pain.  I have refilled your asthma medications, please follow-up with your primary care provider for further management of your COPD and asthma.  Return to clinic for any new or concerning symptoms.
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
## 2022-06-09 NOTE — ED Triage Notes (Signed)
Pt c/o asthma exacerbation onset "a few weeks ago" has been using albuterol inhaler and neb which helped but pt has ran out of medications, requesting refills.   Also c/o right wrist pain. Onset "a while" ago. States has arthritis in both wrists but right is worse and has a knot on it. Associated edema.has tried tylenol and motrin otc without relief.

## 2022-06-15 ENCOUNTER — Ambulatory Visit: Payer: Commercial Managed Care - PPO | Admitting: Occupational Therapy

## 2022-07-19 ENCOUNTER — Encounter (HOSPITAL_COMMUNITY): Payer: Self-pay

## 2022-07-19 ENCOUNTER — Other Ambulatory Visit: Payer: Self-pay

## 2022-07-19 ENCOUNTER — Ambulatory Visit (HOSPITAL_COMMUNITY)
Admission: RE | Admit: 2022-07-19 | Discharge: 2022-07-19 | Disposition: A | Discharge status: Home / Self Care | Payer: Commercial Managed Care - PPO | Source: Ambulatory Visit

## 2022-07-19 VITALS — BP 155/88 | HR 90 | Temp 98.4°F | Resp 22

## 2022-07-19 DIAGNOSIS — J4521 Mild intermittent asthma with (acute) exacerbation: Secondary | ICD-10-CM

## 2022-07-19 MED ORDER — METHYLPREDNISOLONE ACETATE 40 MG/ML IJ SUSP
INTRAMUSCULAR | Status: AC
  Filled 2022-07-19: qty 1

## 2022-07-19 MED ORDER — PREDNISONE 20 MG PO TABS: 40.0000 mg | ORAL_TABLET | Freq: Every day | ORAL | 0 refills | Status: AC

## 2022-07-19 MED ORDER — IPRATROPIUM-ALBUTEROL 0.5-2.5 (3) MG/3ML IN SOLN
3.0000 mL | Freq: Once | RESPIRATORY_TRACT | Status: AC
  Administered 2022-07-19: 3 mL via RESPIRATORY_TRACT

## 2022-07-19 MED ORDER — METHYLPREDNISOLONE ACETATE 40 MG/ML IJ SUSP
40.0000 mg | Freq: Once | INTRAMUSCULAR | Status: AC
  Administered 2022-07-19: 40 mg via INTRAMUSCULAR

## 2022-07-19 MED ORDER — IPRATROPIUM-ALBUTEROL 0.5-2.5 (3) MG/3ML IN SOLN
RESPIRATORY_TRACT | Status: AC
  Filled 2022-07-19: qty 3

## 2022-07-19 MED ORDER — ALBUTEROL SULFATE HFA 108 (90 BASE) MCG/ACT IN AERS: 2.0000 | INHALATION_SPRAY | RESPIRATORY_TRACT | 0 refills | Status: DC | PRN
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
## 2022-07-19 NOTE — Discharge Instructions (Addendum)
We gave you a breathing treatment today.  Please continue the albuterol inhaler every 4-6 hours as needed for wheezing or shortness of breath.  We also gave you a shot of steroid today, start the oral steroids tomorrow morning.  Seek care for persistent/worsening symptoms despite treatment.

## 2022-07-19 NOTE — ED Triage Notes (Addendum)
C/o of asthma flare up.  Symptoms started 2 days ago.  Speaking in complete sentences. Audible wheezing.  Patient took ibuprofen for a headache and believes this triggered asthma.    Patient is out of albuterol inhaler.  Patient has been using albuterol nebulizer treatments Reports he has a pcp and missed an appt last week because of working out of town.

## 2022-08-11 ENCOUNTER — Encounter (HOSPITAL_BASED_OUTPATIENT_CLINIC_OR_DEPARTMENT_OTHER): Payer: Self-pay | Admitting: Emergency Medicine

## 2022-08-11 ENCOUNTER — Emergency Department (HOSPITAL_BASED_OUTPATIENT_CLINIC_OR_DEPARTMENT_OTHER)
Admission: EM | Admit: 2022-08-11 | Discharge: 2022-08-11 | Disposition: A | Discharge status: Home / Self Care | Payer: Commercial Managed Care - PPO | Attending: Emergency Medicine | Admitting: Emergency Medicine

## 2022-08-11 ENCOUNTER — Other Ambulatory Visit: Payer: Self-pay

## 2022-08-11 DIAGNOSIS — S06310A Contusion and laceration of right cerebrum without loss of consciousness, initial encounter: Secondary | ICD-10-CM | POA: Insufficient documentation

## 2022-08-11 DIAGNOSIS — W268XXA Contact with other sharp object(s), not elsewhere classified, initial encounter: Secondary | ICD-10-CM | POA: Diagnosis not present

## 2022-08-11 DIAGNOSIS — J45901 Unspecified asthma with (acute) exacerbation: Secondary | ICD-10-CM | POA: Insufficient documentation

## 2022-08-11 DIAGNOSIS — Z7901 Long term (current) use of anticoagulants: Secondary | ICD-10-CM | POA: Insufficient documentation

## 2022-08-11 DIAGNOSIS — Z7951 Long term (current) use of inhaled steroids: Secondary | ICD-10-CM | POA: Insufficient documentation

## 2022-08-11 DIAGNOSIS — S41111A Laceration without foreign body of right upper arm, initial encounter: Secondary | ICD-10-CM

## 2022-08-11 DIAGNOSIS — Y9389 Activity, other specified: Secondary | ICD-10-CM | POA: Insufficient documentation

## 2022-08-11 DIAGNOSIS — S59911A Unspecified injury of right forearm, initial encounter: Secondary | ICD-10-CM | POA: Diagnosis present

## 2022-08-11 DIAGNOSIS — S51811A Laceration without foreign body of right forearm, initial encounter: Secondary | ICD-10-CM | POA: Insufficient documentation

## 2022-08-11 DIAGNOSIS — Y92810 Car as the place of occurrence of the external cause: Secondary | ICD-10-CM | POA: Diagnosis not present

## 2022-08-11 MED ORDER — ALBUTEROL SULFATE (2.5 MG/3ML) 0.083% IN NEBU
5.0000 mg | INHALATION_SOLUTION | Freq: Once | RESPIRATORY_TRACT | Status: AC
  Administered 2022-08-11: 5 mg via RESPIRATORY_TRACT
  Filled 2022-08-11: qty 6

## 2022-08-11 MED ORDER — IPRATROPIUM BROMIDE 0.02 % IN SOLN
0.5000 mg | Freq: Once | RESPIRATORY_TRACT | Status: AC
  Administered 2022-08-11: 0.5 mg via RESPIRATORY_TRACT
  Filled 2022-08-11: qty 2.5

## 2022-08-11 MED ORDER — PREDNISONE 50 MG PO TABS
60.0000 mg | ORAL_TABLET | Freq: Once | ORAL | Status: AC
  Administered 2022-08-11: 60 mg via ORAL
  Filled 2022-08-11: qty 1

## 2022-08-11 MED ORDER — PREDNISONE 20 MG PO TABS: 40.0000 mg | ORAL_TABLET | Freq: Every day | ORAL | 0 refills | Status: DC

## 2022-08-11 MED ORDER — ALBUTEROL SULFATE HFA 108 (90 BASE) MCG/ACT IN AERS
INHALATION_SPRAY | RESPIRATORY_TRACT | Status: AC
  Administered 2022-08-11: 2
  Filled 2022-08-11: qty 6.7

## 2022-08-11 NOTE — ED Provider Notes (Signed)
[place] [facility] Provider Note   CSN: [id number] Arrival date & time: 08/11/22  1602     History  Chief Complaint  Patient presents with   Shortness of Breath   Laceration    Stephen Miller is a 57 y.o. male.  Patient presents the emergency department today for evaluation of a laceration sustained to the right upper arm today.  Patient was working on a car and cut the arm on a piece of metal.  Wound was cleaned on arrival.  His tetanus up-to-date 7 years ago.  No distal numbness or tingling.  He also reports poor control of his asthma over the past several days.  He is a Psychologist, occupational.  He reports respiratory irritants at his job.  Normally he manages this with a respirator.  He has had increased cough without fevers with increased wheezing over the past several days.       Home Medications Prior to Admission medications   Medication Sig Start Date End Date Taking? Authorizing Provider  albuterol (VENTOLIN HFA) 108 (90 Base) MCG/ACT inhaler Inhale 2 puffs into the lungs every 4 (four) hours as needed for wheezing or shortness of breath. 07/19/22   Valentino Nose, NP  amLODipine (NORVASC) 5 MG tablet Take 1 tablet (5 mg total) by mouth daily. Patient not taking: Reported on 03/24/2022 01/31/21 03/05/22  Lynn Ito, MD  atorvastatin (LIPITOR) 40 MG tablet Take by mouth. Patient not taking: Reported on 03/24/2022 12/31/21   [provider]  BAYER LOW DOSE 81 MG EC tablet Take 81 mg by mouth daily. Swallow whole.    [provider]  diclofenac Sodium (VOLTAREN) 1 % GEL Apply 2 g topically 3 (three) times daily. 03/05/22   Rising, Lurena Joiner, PA-C  hydrochlorothiazide (HYDRODIURIL) 25 MG tablet Take by mouth. 01/27/22   [provider]  ipratropium-albuterol (DUONEB) 0.5-2.5 (3) MG/3ML SOLN Take 3 mLs by nebulization every 6 (six) hours as needed. 06/09/22   Garrison, [place] N, FNP  tadalafil (CIALIS) 10 MG tablet Take 10 mg  by mouth as needed.    [provider]  budesonide-formoterol (SYMBICORT) 80-4.5 MCG/ACT inhaler Inhale 2 puffs into the lungs 2 (two) times daily. Patient not taking: Reported on 10/24/2017 08/28/16 08/21/19  Allegra Grana, FNP  omeprazole (PRILOSEC) 40 MG capsule Take 1 capsule (40 mg total) by mouth 2 (two) times daily before a meal. Patient not taking: Reported on 10/24/2017 10/11/16 08/21/19  Isa Rankin, MD      Allergies    Iodinated contrast media, Ketorolac tromethamine, Other, and Tramadol    Review of Systems   Review of Systems  Physical Exam Updated Vital Signs BP (!) 131/96 (BP Location: Left Arm)   Pulse 90   Temp 98 F (36.7 C)   Resp 20   Ht 5\' 11"  (1.803 m)   Wt 107 kg   SpO2 97%   BMI 32.92 kg/m   Physical Exam Vitals and nursing note reviewed.  Constitutional:      General: He is not in acute distress.    Appearance: He is well-developed.  HENT:     Head: Normocephalic and atraumatic.  Eyes:     General:        Right eye: No discharge.        Left eye: No discharge.     Conjunctiva/sclera: Conjunctivae normal.  Cardiovascular:     Rate and Rhythm: Normal rate and regular rhythm.     Heart sounds:  Normal heart sounds.  Pulmonary:     Effort: Pulmonary effort is normal.     Breath sounds: Wheezing (End expiratory wheezing bilaterally) present. No rhonchi or rales.  Abdominal:     Palpations: Abdomen is soft.     Tenderness: There is no abdominal tenderness.  Musculoskeletal:     Cervical back: Normal range of motion and neck supple.  Skin:    General: Skin is warm and dry.     Comments: 2 cm superficial linear laceration to the lateral aspect of the proximal right forearm.  Wound base appears clean.  No foreign bodies.  Neurological:     Mental Status: He is alert.     ED Results / Procedures / Treatments   Labs (all labs ordered are listed, but only abnormal results are displayed) Labs Reviewed - No data to  display  EKG None  Radiology No results found.  Procedures .Marland KitchenLaceration Repair  Date/Time: 08/11/2022 5:22 PM  Performed by: Renne Crigler, PA-C Authorized by: Renne Crigler, PA-C   Consent:    Consent obtained:  Verbal   Risks discussed:  Pain and infection Universal protocol:    Patient identity confirmed:  Verbally with patient Anesthesia:    Anesthesia method:  None Laceration details:    Length (cm):  2 Treatment:    Area cleansed with:  Shur-Clens   Amount of cleaning:  Standard Skin repair:    Repair method:  Tissue adhesive Approximation:    Approximation:  Close Repair type:    Repair type:  Simple Post-procedure details:    Dressing:  Open (no dressing)   Procedure completion:  Tolerated well, no immediate complications     Medications Ordered in ED Medications  predniSONE (DELTASONE) tablet 60 mg (has no administration in time range)  albuterol (VENTOLIN HFA) 108 (90 Base) MCG/ACT inhaler (2 puffs  Given by Other 08/11/22 1627)  albuterol (PROVENTIL) (2.5 MG/3ML) 0.083% nebulizer solution 5 mg (5 mg Nebulization Given 08/11/22 1716)  ipratropium (ATROVENT) nebulizer solution 0.5 mg (0.5 mg Nebulization Given 08/11/22 1716)    ED Course/ Medical Decision Making/ A&P    Patient seen and examined. History obtained directly from patient.   Labs/EKG: None ordered  Imaging: None ordered  Medications/Fluids: Ordered: Dermabond, albuterol/Atrovent, oral prednisone.   Most recent vital signs reviewed and are as follows: BP (!) 131/96 (BP Location: Left Arm)   Pulse 90   Temp 98 F (36.7 C)   Resp 20   Ht 5\' 11"  (1.803 m)   Wt 107 kg   SpO2 97%   BMI 32.92 kg/m   Initial impression: Asthma exacerbation, superficial laceration  6:15 PM Reassessment performed. Patient appears stable.  On reexam, very minimal expiratory wheezing noted.  Patient states that clinically his breathing is improved.  Reviewed pertinent lab work and imaging with patient  at bedside. Questions answered.   Most current vital signs reviewed and are as follows: BP (!) 131/96 (BP Location: Left Arm)   Pulse 90   Temp 98 F (36.7 C)   Resp 20   Ht 5\' 11"  (1.803 m)   Wt 107 kg   SpO2 97%   BMI 32.92 kg/m   Plan: Discharge to home.   Prescriptions written for: Prednisone  Other home care instructions discussed: Avoidance of triggers, use of albuterol  ED return instructions discussed: Worsening shortness of breath, chest pain, fever, new symptoms or other concerns.  Follow-up instructions discussed: Patient encouraged to follow-up with their PCP in 3 days if not  getting better.                                 Medical Decision Making Risk Prescription drug management.   Asthma exacerbation: Ongoing for several days, improved with albuterol and Atrovent in the ED.  Patient be given burst of prednisone.  Low concern for pneumonia or other infectious insult.  Most likely environmental.  Do not suspect PE, ACS, pneumothorax.  Laceration: Very superficial, closed with tissue adhesive without difficulty.  No signs of infection.  The patient's vital signs, pertinent lab work and imaging were reviewed and interpreted as discussed in the ED course. Hospitalization was considered for further testing, treatments, or serial exams/observation. However as patient is well-appearing, has a stable exam, and reassuring studies today, I do not feel that they warrant admission at this time. This plan was discussed with the patient who verbalizes agreement and comfort with this plan and seems reliable and able to return to the Emergency Department with worsening or changing symptoms.          Final Clinical Impression(s) / ED Diagnoses Final diagnoses:  Exacerbation of asthma, unspecified asthma severity, unspecified whether persistent  Laceration and contusion of cerebral cortex, right, without loss of consciousness, initial encounter [facility])    Rx / DC  Orders ED Discharge Orders     None         Renne Crigler, PA-C 08/11/22 1821    Lonell Grandchild, MD 08/13/22 [phone]
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
## 2022-08-11 NOTE — Discharge Instructions (Signed)
Please fill and take the prednisone to help control asthma.  You may use the albuterol inhaler, 2 puffs every 4 hours, for the next day and then as needed afterwards.  Current with fever, worsening shortness of breath, chest pain, new symptoms or other concerns.  Return with worsening pain, redness, swelling around the site of the laceration, pus draining, or any other concerns.

## 2022-08-25 ENCOUNTER — Ambulatory Visit (HOSPITAL_COMMUNITY)
Admission: RE | Admit: 2022-08-25 | Discharge: 2022-08-25 | Disposition: A | Discharge status: Home / Self Care | Payer: Commercial Managed Care - PPO | Source: Ambulatory Visit | Attending: Family Medicine | Admitting: Family Medicine

## 2022-08-25 ENCOUNTER — Encounter (HOSPITAL_COMMUNITY): Payer: Self-pay

## 2022-08-25 VITALS — BP 138/88 | HR 92 | Temp 98.2°F | Resp 18

## 2022-08-25 DIAGNOSIS — R519 Headache, unspecified: Secondary | ICD-10-CM

## 2022-08-25 MED ORDER — DEXAMETHASONE SODIUM PHOSPHATE 10 MG/ML IJ SOLN
10.0000 mg | Freq: Once | INTRAMUSCULAR | Status: AC
  Administered 2022-08-25: 10 mg via INTRAMUSCULAR

## 2022-08-25 MED ORDER — CYCLOBENZAPRINE HCL 10 MG PO TABS: ORAL_TABLET | ORAL | 0 refills | Status: DC

## 2022-08-25 MED ORDER — DEXAMETHASONE SODIUM PHOSPHATE 10 MG/ML IJ SOLN
INTRAMUSCULAR | Status: AC
  Filled 2022-08-25: qty 1

## 2022-08-25 NOTE — Discharge Instructions (Signed)
Meds ordered this encounter  Medications   dexamethasone (DECADRON) injection 10 mg   cyclobenzaprine (FLEXERIL) 10 MG tablet    Sig: Take 1 tablet by mouth 3 times daily as needed for tension headache. Warning: May cause drowsiness.    Dispense:  21 tablet    Refill:  0

## 2022-08-25 NOTE — ED Triage Notes (Signed)
Pt states he has had a headache since yesterday he is light sensitive. He took tylenol last night without relief. He is unable to sleep.
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
## 2022-11-05 ENCOUNTER — Encounter: Payer: Self-pay | Admitting: Nurse Practitioner

## 2022-11-29 ENCOUNTER — Ambulatory Visit (HOSPITAL_COMMUNITY)
Admission: EM | Admit: 2022-11-29 | Discharge: 2022-11-29 | Disposition: A | Discharge status: Home / Self Care | Payer: Commercial Managed Care - PPO

## 2022-11-29 ENCOUNTER — Encounter (HOSPITAL_COMMUNITY): Payer: Self-pay

## 2022-11-29 DIAGNOSIS — K0889 Other specified disorders of teeth and supporting structures: Secondary | ICD-10-CM

## 2022-11-29 DIAGNOSIS — K047 Periapical abscess without sinus: Secondary | ICD-10-CM | POA: Diagnosis not present

## 2022-11-29 MED ORDER — AMOXICILLIN-POT CLAVULANATE 875-125 MG PO TABS: 1.0000 | ORAL_TABLET | Freq: Two times a day (BID) | ORAL | 0 refills | Status: DC

## 2022-11-29 NOTE — Discharge Instructions (Addendum)
I believe your dental pain and swelling is caused from an infected tooth.  Take the antibiotics with breakfast and dinner to help treat the infection.  You can also alternate between 800 mg of ibuprofen and 500 mg of Tylenol every 4-6 hours for pain and inflammation.  It is important that you follow-up with a dentist, as without adequate treatment this infection may keep reoccurring.  Return to clinic for any new or urgent symptoms.  Below are some dental resources.  [facility] dental [facility] [place] Address: [address] [place], [place], [place] [address] Phone: [phone]  [facility] [phone] extension [phone] [address]  Dr. Lawrence Marseilles [phone] [address]  [place] [phone] [address] [facility] [place].  [facility] [phone] extension [phone] [place], [place], [address] First come first serve for the first 10 clients.  May do simple extractions only, no wisdom teeth or surgery.  You may try the second for Thursday of the month starting at 6:30 AM.  [facility] [employer] [facility] You may call the school to see if they are still helping to provide dental care for emergent cases.

## 2022-11-29 NOTE — ED Triage Notes (Signed)
Pt presents with 8/10 right, lower tooth pain reporting "I feel like there is a knot in the area" x 2 days. Pt denies taking medications for his pain.
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
## 2023-01-11 ENCOUNTER — Ambulatory Visit (INDEPENDENT_AMBULATORY_CARE_PROVIDER_SITE_OTHER): Payer: Commercial Managed Care - PPO

## 2023-01-11 ENCOUNTER — Encounter (HOSPITAL_COMMUNITY): Payer: Self-pay

## 2023-01-11 ENCOUNTER — Telehealth (HOSPITAL_COMMUNITY): Payer: Self-pay | Admitting: Family Medicine

## 2023-01-11 ENCOUNTER — Ambulatory Visit (HOSPITAL_COMMUNITY)
Admission: EM | Admit: 2023-01-11 | Discharge: 2023-01-11 | Disposition: A | Discharge status: Home / Self Care | Payer: Commercial Managed Care - PPO | Attending: Family Medicine | Admitting: Family Medicine

## 2023-01-11 DIAGNOSIS — R051 Acute cough: Secondary | ICD-10-CM | POA: Diagnosis not present

## 2023-01-11 DIAGNOSIS — J4550 Severe persistent asthma, uncomplicated: Secondary | ICD-10-CM | POA: Diagnosis not present

## 2023-01-11 DIAGNOSIS — B370 Candidal stomatitis: Secondary | ICD-10-CM

## 2023-01-11 LAB — POC COVID19/FLU A&B COMBO
Covid Antigen, POC: NEGATIVE
Influenza A Antigen, POC: NEGATIVE
Influenza B Antigen, POC: NEGATIVE

## 2023-01-11 MED ORDER — ALBUTEROL SULFATE (2.5 MG/3ML) 0.083% IN NEBU
2.5000 mg | INHALATION_SOLUTION | Freq: Once | RESPIRATORY_TRACT | Status: AC
  Administered 2023-01-11: 2.5 mg via RESPIRATORY_TRACT

## 2023-01-11 MED ORDER — FLUCONAZOLE 100 MG PO TABS: 100.0000 mg | ORAL_TABLET | Freq: Every day | ORAL | 1 refills | Status: AC

## 2023-01-11 MED ORDER — AIRSUPRA 90-80 MCG/ACT IN AERO: 2.0000 | INHALATION_SPRAY | RESPIRATORY_TRACT | 0 refills | Status: DC | PRN

## 2023-01-11 MED ORDER — ALBUTEROL SULFATE (2.5 MG/3ML) 0.083% IN NEBU
INHALATION_SOLUTION | RESPIRATORY_TRACT | Status: AC
  Filled 2023-01-11: qty 3

## 2023-01-11 NOTE — Discharge Instructions (Addendum)
 COVID and flu test are negative.  Chest x-ray is negative for pneumonia.  Educated about asthma.  Breath sounds improved after nebulizer treatment.  Discussed and strongly encouraged to stop vaping.  There are many chemicals in the vape and nicotine both are potential triggers for asthma exacerbations.  Renewed his Airsupra  prescription.  Use Airsupra , 2 puffs, every 4 hours, as needed for wheezing or shortness of breath.  His x-ray will be read by radiology and if the impression differs from my impression or shows anything that would change the plan of care he will be contacted.  Otherwise the results will be available on the portal.  Return here if symptoms do not improve, worsen or if new symptoms appear.

## 2023-01-11 NOTE — Progress Notes (Signed)
Negative chest x-ray.  Patient's phone does not have voicemail set up.  I left a message on his wife's phone (VM).  Advised of results and also that I am calling in Fluconzole for this oral thrush.

## 2023-01-11 NOTE — Telephone Encounter (Signed)
Patient had oral thrush during his visit and needed Fluconazole.  I forgot to send it in, so I am sending it now.  His phone does not have VM but I left a message on his wife's VM updating him about this prescription.

## 2023-01-11 NOTE — ED Triage Notes (Signed)
Pt states cough, sob,nasal and chest congestion since last night.  States he has not taken anything at home for it.
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
## 2023-01-16 ENCOUNTER — Emergency Department (HOSPITAL_COMMUNITY): Payer: Commercial Managed Care - PPO

## 2023-01-16 ENCOUNTER — Emergency Department (HOSPITAL_COMMUNITY)
Admission: EM | Admit: 2023-01-16 | Discharge: 2023-01-16 | Disposition: A | Discharge status: Home / Self Care | Payer: Commercial Managed Care - PPO | Attending: Emergency Medicine | Admitting: Emergency Medicine

## 2023-01-16 DIAGNOSIS — Z20822 Contact with and (suspected) exposure to covid-19: Secondary | ICD-10-CM | POA: Diagnosis not present

## 2023-01-16 DIAGNOSIS — J101 Influenza due to other identified influenza virus with other respiratory manifestations: Secondary | ICD-10-CM | POA: Diagnosis not present

## 2023-01-16 DIAGNOSIS — R059 Cough, unspecified: Secondary | ICD-10-CM | POA: Diagnosis present

## 2023-01-16 LAB — RESP PANEL BY RT-PCR (RSV, FLU A&B, COVID)  RVPGX2
Influenza A by PCR: POSITIVE — AB
Influenza B by PCR: NEGATIVE
Resp Syncytial Virus by PCR: NEGATIVE
SARS Coronavirus 2 by RT PCR: NEGATIVE

## 2023-01-16 LAB — CBC WITH DIFFERENTIAL/PLATELET
Abs Immature Granulocytes: 0.03 10*3/uL (ref 0.00–0.07)
Basophils Absolute: 0 10*3/uL (ref 0.0–0.1)
Basophils Relative: 0 %
Eosinophils Absolute: 0.1 10*3/uL (ref 0.0–0.5)
Eosinophils Relative: 1 %
HCT: 41.2 % (ref 39.0–52.0)
Hemoglobin: 14 g/dL (ref 13.0–17.0)
Immature Granulocytes: 1 %
Lymphocytes Relative: 13 %
Lymphs Abs: 0.7 10*3/uL (ref 0.7–4.0)
MCH: 30.7 pg (ref 26.0–34.0)
MCHC: 34 g/dL (ref 30.0–36.0)
MCV: 90.4 fL (ref 80.0–100.0)
Monocytes Absolute: 0.7 10*3/uL (ref 0.1–1.0)
Monocytes Relative: 13 %
Neutro Abs: 3.7 10*3/uL (ref 1.7–7.7)
Neutrophils Relative %: 72 %
Platelets: 197 10*3/uL (ref 150–400)
RBC: 4.56 MIL/uL (ref 4.22–5.81)
RDW: 13.2 % (ref 11.5–15.5)
WBC: 5.2 10*3/uL (ref 4.0–10.5)
nRBC: 0 % (ref 0.0–0.2)

## 2023-01-16 LAB — BASIC METABOLIC PANEL
Anion gap: 12 (ref 5–15)
BUN: 8 mg/dL (ref 6–20)
CO2: 24 mmol/L (ref 22–32)
Calcium: 8.8 mg/dL — ABNORMAL LOW (ref 8.9–10.3)
Chloride: 98 mmol/L (ref 98–111)
Creatinine, Ser: 1.23 mg/dL (ref 0.61–1.24)
GFR, Estimated: >60 mL/min (ref >60)
Glucose, Bld: 105 mg/dL — ABNORMAL HIGH (ref 70–99)
Potassium: 3.7 mmol/L (ref 3.5–5.1)
Sodium: 134 mmol/L — ABNORMAL LOW (ref 135–145)

## 2023-01-16 MED ORDER — PREDNISONE 10 MG PO TABS: 40.0000 mg | ORAL_TABLET | Freq: Every day | ORAL | 0 refills | Status: AC

## 2023-01-16 NOTE — Discharge Instructions (Addendum)
 You tested positive for the flu.  This is likely why you are having a more difficult time with your breathing.  I sent your prescription for steroid which you can take for the next 5 days.  You take Motrin  and Tylenol  at home.  Your symptoms should begin improved for the next few days.  Return to the emergency department if you develop increased difficulty with your breathing.

## 2023-01-16 NOTE — ED Provider Triage Note (Signed)
 Emergency Medicine Provider Triage Evaluation Note  Stephen Miller , a 58 y.o. male  was evaluated in triage.  Pt complains of several days of cough. Hx of asthma..  Review of Systems    Physical Exam  BP (!) 147/90 (BP Location: Right Arm)   Pulse (!) 101   Temp 100.1 F (37.8 C)   Resp (!) 22   SpO2 94%  Gen:   Awake, no distress   Resp:  Normal effort, diffuse wheezing MSK:   Moves extremities without difficulty  Other:    Medical Decision Making  Medically screening exam initiated at 12:25 PM.  Appropriate orders placed.  Marisol Glazer was informed that the remainder of the evaluation will be completed by another provider, this initial triage assessment does not replace that evaluation, and the importance of remaining in the ED until their evaluation is complete.  URI exacerbating underlying asthma vs PNA. CXR, swabs , and basic blood work ordered. No respiratory distress.   Mannie Pac T, DO 01/16/23 1226

## 2023-01-16 NOTE — ED Triage Notes (Signed)
 Pt c/o URI symptoms including cough, congestion, malaise, SOB, fever for over one week.

## 2023-01-16 NOTE — ED Provider Notes (Signed)
 [place] EMERGENCY DEPARTMENT AT [facility] Provider Note   CSN: [id number] Arrival date & time: 01/16/23  1206     History  Chief Complaint  Patient presents with   URI   Shortness of Breath    Stephen Miller is a 58 y.o. male.  58 year old male here today for cough, congestion, weakness.  Symptoms have been ongoing for 3 to 4 days.   URI Shortness of Breath      Home Medications Prior to Admission medications   Medication Sig Start Date End Date Taking? Authorizing Provider  predniSONE  (DELTASONE ) 10 MG tablet Take 4 tablets (40 mg total) by mouth daily for 5 days. 01/16/23 01/21/23 Yes Mannie Pac T, DO  AIRSUPRA  90-80 MCG/ACT AERO Inhale 2 puffs into the lungs every 4 (four) hours as needed (wheezing or shortness of breath). 01/11/23 02/10/23  Ival Domino, FNP  BAYER LOW DOSE 81 MG EC tablet Take 81 mg by mouth daily. Swallow whole.    [provider]  fluconazole  (DIFLUCAN ) 100 MG tablet Take 1 tablet (100 mg total) by mouth daily for 7 days. By mouth daily for 7 days.  Once prescription is complete, wait 2-3 weeks.  May refill and repeat medication if any symptoms persist after 2-3 weeks 01/11/23 01/18/23  Ival Domino, FNP  hydrochlorothiazide  (HYDRODIURIL ) 25 MG tablet Take by mouth. 01/27/22   [provider]  tadalafil (CIALIS) 10 MG tablet Take 10 mg by mouth as needed.    [provider]  budesonide -formoterol  (SYMBICORT ) 80-4.5 MCG/ACT inhaler Inhale 2 puffs into the lungs 2 (two) times daily. Patient not taking: Reported on 10/24/2017 08/28/16 08/21/19  Dineen Rollene MATSU, FNP  omeprazole  (PRILOSEC) 40 MG capsule Take 1 capsule (40 mg total) by mouth 2 (two) times daily before a meal. Patient not taking: Reported on 10/24/2017 10/11/16 08/21/19  Jason Leita Blush, MD      Allergies    Iodinated contrast media, Ketorolac  tromethamine , Other, and Tramadol    Review of Systems   Review of Systems  Respiratory:  Positive  for shortness of breath.     Physical Exam Updated Vital Signs BP (!) 142/83   Pulse 86   Temp (!) 100.5 F (38.1 C)   Resp 16   SpO2 100%  Physical Exam Vitals reviewed.  HENT:     Mouth/Throat:     Pharynx: No pharyngeal swelling or oropharyngeal exudate.  Eyes:     Pupils: Pupils are equal, round, and reactive to light.  Cardiovascular:     Rate and Rhythm: Normal rate.  Pulmonary:     Effort: Pulmonary effort is normal.     Breath sounds: Wheezing present.  Neurological:     Mental Status: He is alert.     ED Results / Procedures / Treatments   Labs (all labs ordered are listed, but only abnormal results are displayed) Labs Reviewed  RESP PANEL BY RT-PCR (RSV, FLU A&B, COVID)  RVPGX2 - Abnormal; Notable for the following components:      Result Value   Influenza A by PCR POSITIVE (*)    All other components within normal limits  BASIC METABOLIC PANEL - Abnormal; Notable for the following components:   Sodium 134 (*)    Glucose, Bld 105 (*)    Calcium 8.8 (*)    All other components within normal limits  CBC WITH DIFFERENTIAL/PLATELET    EKG None  Radiology DG Chest 1 View Result Date: 01/16/2023 CLINICAL DATA:  10031 Cough 10031 EXAM:  CHEST  1 VIEW COMPARISON:  CXR 01/11/23 FINDINGS: No pleural effusion. No pneumothorax. Normal cardiac and mediastinal contours. No focal airspace opacity. No radiographically apparent displaced rib fractures. Visualized upper abdomen is unremarkable. IMPRESSION: No focal airspace opacity Electronically Signed   By: Lyndall Gore M.D.   On: 01/16/2023 13:39    Procedures Procedures    Medications Ordered in ED Medications - No data to display  ED Course/ Medical Decision Making/ A&P                                 Medical Decision Making 58 year old male here today with cough, congestion.  Does have a history of asthma.  Has noticed he has been wheezing more.  Differential diagnoses include viral syndrome,, reactive  airway disease second viral syndrome, less likely pneumonia.  Plan-patient had chest x-ray done, which per my independent review shows no pneumonia.  Reviewed the patient's blood work, no leukocytosis.  Influenza positive.  Will discharge patient with short course of steroids given the wheezing that he has, likely being exacerbated by his underlying reactive airway disease.  Amount and/or Complexity of Data Reviewed Labs: ordered. Radiology: ordered.           Final Clinical Impression(s) / ED Diagnoses Final diagnoses:  Influenza A    Rx / DC Orders ED Discharge Orders          Ordered    predniSONE  (DELTASONE ) 10 MG tablet  Daily        01/16/23 1753              Mannie Pac T, DO 01/16/23 1753

## 2023-01-20 ENCOUNTER — Encounter: Payer: Self-pay | Admitting: Adult Health

## 2023-01-20 ENCOUNTER — Ambulatory Visit (INDEPENDENT_AMBULATORY_CARE_PROVIDER_SITE_OTHER): Payer: Commercial Managed Care - PPO | Admitting: Adult Health

## 2023-01-20 ENCOUNTER — Encounter: Payer: Self-pay | Admitting: *Deleted

## 2023-01-20 ENCOUNTER — Telehealth: Payer: Self-pay | Admitting: *Deleted

## 2023-01-20 VITALS — BP 128/76 | HR 104 | Temp 98.6°F | Ht 71.0 in | Wt 248.8 lb

## 2023-01-20 DIAGNOSIS — Z720 Tobacco use: Secondary | ICD-10-CM

## 2023-01-20 DIAGNOSIS — J101 Influenza due to other identified influenza virus with other respiratory manifestations: Secondary | ICD-10-CM | POA: Diagnosis not present

## 2023-01-20 DIAGNOSIS — J4489 Other specified chronic obstructive pulmonary disease: Secondary | ICD-10-CM

## 2023-01-20 DIAGNOSIS — J310 Chronic rhinitis: Secondary | ICD-10-CM

## 2023-01-20 MED ORDER — PREDNISONE 10 MG PO TABS: ORAL_TABLET | ORAL | 0 refills | Status: DC

## 2023-01-20 MED ORDER — DOXYCYCLINE HYCLATE 100 MG PO TABS
100.0000 mg | ORAL_TABLET | Freq: Two times a day (BID) | ORAL | 0 refills | Status: DC
Start: 2023-01-20 — End: 2023-03-29

## 2023-01-20 MED ORDER — ALBUTEROL SULFATE HFA 108 (90 BASE) MCG/ACT IN AERS: 2.0000 | INHALATION_SPRAY | Freq: Four times a day (QID) | RESPIRATORY_TRACT | 2 refills | Status: DC | PRN

## 2023-01-20 NOTE — Assessment & Plan Note (Signed)
 Smoking cessation discussed

## 2023-01-20 NOTE — Assessment & Plan Note (Signed)
 Influenza A positive-clinically is starting to improve with resolution of fever and bodyaches.  Continue with supportive care. Treat secondary superimposed infection with acute asthmatic bronchitis.  Plan  Patient Instructions  Begin Doxycycline  Twice daily  for 1 week  Begin Prednisone  taper .  Mucinex  DM Twice daily  As needed  cough/congestion  Saline nasal rinses As needed   Continue on Trelegy 1 puff daily, rinse after use.  Duoneb As needed   Change Airsupra  to Albuterol  inhaler As needed   Fluids and rest  Work on not smoking .  Follow up with Dr. Meade in 6-8 weeks and As needed   Please contact office for sooner follow up if symptoms do not improve or worsen or seek emergency care
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
## 2023-01-20 NOTE — Telephone Encounter (Signed)
 ATC patient x1.  No answer, no VM.  Will send mychart message.

## 2023-01-20 NOTE — Patient Instructions (Addendum)
 Begin Doxycycline  Twice daily  for 1 week  Begin Prednisone  taper .  Mucinex  DM Twice daily  As needed  cough/congestion  Saline nasal rinses As needed   Continue on Trelegy 1 puff daily, rinse after use.  Duoneb As needed   Change Airsupra  to Albuterol  inhaler As needed   Fluids and rest  Work on not smoking .  Follow up with Dr. Meade in 6-8 weeks and As needed   Please contact office for sooner follow up if symptoms do not improve or worsen or seek emergency care

## 2023-01-20 NOTE — Assessment & Plan Note (Signed)
 Acute COPD with asthmatic bronchitic exacerbation.  In the setting of influenza A.-Patient is having some improvement but continues to have residual cough congestion and wheezing.  Will treat with an empiric course of antibiotic doxycycline  x 1 week.  And a slow prednisone  taper over the next week.  Continue with supportive care.  Mucinex  DM twice daily for needed for cough and congestion.  Continue on triple therapy maintenance inhaler with Trelegy.  May use albuterol  or DuoNeb as needed  Plan  Patient Instructions  Begin Doxycycline  Twice daily  for 1 week  Begin Prednisone  taper .  Mucinex  DM Twice daily  As needed  cough/congestion  Saline nasal rinses As needed   Continue on Trelegy 1 puff daily, rinse after use.  Duoneb As needed   Change Airsupra  to Albuterol  inhaler As needed   Fluids and rest  Work on not smoking .  Follow up with Dr. Meade in 6-8 weeks and As needed   Please contact office for sooner follow up if symptoms do not improve or worsen or seek emergency care

## 2023-01-20 NOTE — Assessment & Plan Note (Signed)
 Saline nasal rinses as needed

## 2023-02-10 ENCOUNTER — Telehealth: Payer: Self-pay | Admitting: Adult Health

## 2023-02-10 NOTE — Telephone Encounter (Signed)
PT states he is still sick even after his 1/9 acute visit. Pls call @ [phone] States he is not using his inhaler. Would like Pred.    Pharm is [facility].

## 2023-02-11 NOTE — Telephone Encounter (Signed)
ATC x2.  No answer.  No VM

## 2023-02-11 NOTE — Telephone Encounter (Signed)
ATC X1. Went straight to vm, vm not set up yet.

## 2023-02-14 NOTE — Telephone Encounter (Signed)
Called the pt and there was no answer and no vm set up Closing encounter per protocol

## 2023-03-01 ENCOUNTER — Encounter: Payer: Self-pay | Admitting: Nurse Practitioner

## 2023-03-02 ENCOUNTER — Encounter: Payer: Self-pay | Admitting: Internal Medicine

## 2023-03-02 ENCOUNTER — Ambulatory Visit: Payer: Commercial Managed Care - PPO | Admitting: Internal Medicine

## 2023-03-22 ENCOUNTER — Ambulatory Visit: Payer: Commercial Managed Care - PPO | Admitting: Internal Medicine

## 2023-03-22 ENCOUNTER — Encounter: Payer: Self-pay | Admitting: Internal Medicine

## 2023-03-22 ENCOUNTER — Encounter: Payer: Self-pay | Admitting: *Deleted

## 2023-03-22 VITALS — BP 138/90 | HR 90 | Temp 98.2°F | Ht 71.0 in | Wt 241.4 lb

## 2023-03-22 DIAGNOSIS — F17210 Nicotine dependence, cigarettes, uncomplicated: Secondary | ICD-10-CM

## 2023-03-22 DIAGNOSIS — J45909 Unspecified asthma, uncomplicated: Secondary | ICD-10-CM | POA: Diagnosis not present

## 2023-03-22 DIAGNOSIS — J4489 Other specified chronic obstructive pulmonary disease: Secondary | ICD-10-CM

## 2023-03-22 MED ORDER — CETIRIZINE HCL 10 MG PO TABS: 10.0000 mg | ORAL_TABLET | Freq: Every day | ORAL | 5 refills | Status: AC

## 2023-03-22 MED ORDER — AIRSUPRA 90-80 MCG/ACT IN AERO: 2.0000 | INHALATION_SPRAY | RESPIRATORY_TRACT | 5 refills | Status: DC | PRN

## 2023-03-22 MED ORDER — MONTELUKAST SODIUM 10 MG PO TABS: 10.0000 mg | ORAL_TABLET | Freq: Every day | ORAL | 11 refills | Status: AC

## 2023-03-22 NOTE — Patient Instructions (Addendum)
 It was a pleasure to see you today!  Please schedule follow up scheduled with myself in 3 months.  If my schedule is not open yet, we will contact you with a reminder closer to that time. Please call [phone] if you haven't heard from [place] a month before, and always call us sooner if issues or concerns arise. You can also send [place] a message through MyChart, but but aware that this is not to be used for urgent issues and it may take up to 5-7 days to receive a reply. Please be aware that you will likely be able to view your results before I have a chance to respond to them. Please give [place] 5 business days to respond to any non-urgent results.    Continue the Trelegy inhaler 1 puff once daily I will refill the Airsupra rescue inhaler for you Continue DuoNeb breathing treatments as needed Your allergy testing and breathing testing is consistent with severe allergic asthma that is not well-controlled You are having very frequent exacerbations requiring prednisone We need to get you on a biologic medication which is injectable for your asthma to get things under good control We will have you fill up the paperwork for Dupixent injectable medication Hopefully we can get you started on this ASAP I am also starting you on montelukast and Zyrtec for your allergies You have very severe sensitivity to dust mites.  Please see the precautions below to help get this under control.  How Can I Prevent Allergic Reactions to Dust Mites?  Remember, having dust mites doesn't mean your house isn't clean. In most areas of the world, these creatures live in every home, no matter how clean. But, you can reduce their effects. There are many changes you can make to your home to reduce the numbers of these unwanted "guests."  Studies show that more dust mites live in your bedroom than anywhere else in your home. So this is the best place to start.  Cover mattresses and pillows in zippered dust-proof covers. These covers  are made of a material with pores too small to let dust mites and their waste product through. They are also called allergen-impermeable. Plastic or vinyl covers are the least expensive, but some people find them uncomfortable. You can buy other fabric allergen-impermeable covers from many regular bedding stores.  Wash your sheets and blankets weekly in hot water. You have to wash them in water that's at least 130 degrees Fahrenheit or more to kill dust mites.  Get rid of all types of fabric that mites love and that you cannot easily wash regularly in hot water. Avoid wall-to-wall carpeting, curtains, blinds, upholstered furniture and down-filled covers and pillows in the bedroom. Put roll-type shades on your windows instead of curtains.  Have someone without a dust mite allergy clean your bedroom. If this is not possible, wear a filtering mask when dusting or vacuuming. Many drug stores carry these items. Dusting and vacuuming stir up dust. So try to do these chores when you can stay out of the bedroom for a while afterward.  Special CERTIFIED filter vacuum cleaners can help to keep mites and mite waste from getting back into the air. CERTIFIED vacuums are scientifically tested and verified as more suitable for making your home healthier.  Vacuuming is not enough to remove all dust mites and their waste. A large amount of the dust mite population may remain because they live deep inside the stuffing of sofas, chairs, mattresses, pillows and carpeting.  Treat other rooms in your house like your bedroom. Here are more tips:  Avoid wall-to-wall carpeting, if possible. If you do use carpeting, mites don't like the type with a short, tight pile as much. Use washable throw rugs over regularly damp-mopped wood, linoleum or tiled floors.  Wash rugs in hot water whenever possible. Cold water isn't as effective. Dry cleaning kills all dust mites and is also good for removing dust mites from living in  fabrics. Keep the humidity in your home less than 50 percent. Use a dehumidifier and/or air conditioner to do this.  Use a CERTIFIED filter with your central furnace and air conditioning unit. This can help trap dust mites from your entire home. Freestanding air cleaners only filter air in a limited area. Avoid devices that treat air with heat, electrostatic ions or ozone.  Websites for allergy bedding covers:  Https://www.asthmaandallergyfriendly.com/  https://www.natlallergy.com/
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
## 2023-03-24 ENCOUNTER — Telehealth: Payer: Self-pay | Admitting: Pharmacist

## 2023-03-24 ENCOUNTER — Other Ambulatory Visit (HOSPITAL_COMMUNITY): Payer: Self-pay

## 2023-03-24 NOTE — Telephone Encounter (Addendum)
 Submitted a Prior Authorization request to [employer] for DUPIXENT via CoverMyMeds. Will update once we receive a response.  Key: [id number]   Per response on CMM: Drug is covered by current benefit plan. No further PA activity needed  Per test claims, insurance does not pay towards medication.      Member Services: [phone]  Called member services (after being re-directed from [employer]) to inquire on insurance. After careful investigation, rep is unable to determine if he has deductible because from her end, there is no deductible. Patient will need to call insurance team at work (HR department?) to determine coverage and why insurance is not paying towards Dupixent  ATC patient. Phone went straight to VM but VM box is not set up. MyChart message sent to pt (unfortunately last log in was Feb 2025)  ----- Message ----- From: Charlott Holler, MD Sent: 03/22/2023   4:10 PM EDT To: Murrell Redden, RPH-CPP  New start dupixent

## 2023-03-28 ENCOUNTER — Telehealth: Payer: Self-pay | Admitting: Internal Medicine

## 2023-03-28 DIAGNOSIS — J4489 Other specified chronic obstructive pulmonary disease: Secondary | ICD-10-CM

## 2023-03-28 MED ORDER — FLUTICASONE PROPIONATE 50 MCG/ACT NA SUSP
1.0000 | Freq: Two times a day (BID) | NASAL | 2 refills | Status: AC
Start: 2023-03-28 — End: ?

## 2023-03-28 MED ORDER — FLUTICASONE PROPIONATE 50 MCG/ACT NA SUSP
1.0000 | Freq: Two times a day (BID) | NASAL | 2 refills | Status: DC
Start: 2023-03-28 — End: 2023-03-28

## 2023-03-28 NOTE — Telephone Encounter (Signed)
 Patient will need to remain on inhalers as there is not an alternative to Dupixent that would be an escalation of treatment (all biologics are equally costly). He is on cetirizine for allergies.   He can consider Flonase 2 puffs into each nostril twice daily. Spoke with wife. She is headed to pharmacy now. She will encourage him to take cetirizine and Flonase. Reviewed above regarding Dupixent. She said that his insurance will REFUND him the cost of Dupixent after he pays it which he cannot afford to do   Chesley Mires, PharmD, MPH, BCPS, CPP Clinical Pharmacist (Rheumatology and Pulmonology)

## 2023-03-28 NOTE — Telephone Encounter (Signed)
 PT called his ins co. They said Dupixant has to be paid for upfront and he explained he did not have 8200.00. The ins co told him to call the Dr. To see if there was a alternative drug. (Adding Devki to this to get  her answer to her closed tel encounter.)

## 2023-03-28 NOTE — Telephone Encounter (Signed)
 PT's wife (DPR) states Pt was just tested for allergies and he has several. I read the last note on Dupixant and directions to her from [facility] on the but until then she wonders if there is anything over the counter Dr. Evaristo Bury recommend to ease his allergy symptoms. Please call her to advise @ [phone]

## 2023-03-29 ENCOUNTER — Encounter (HOSPITAL_COMMUNITY): Payer: Self-pay | Admitting: *Deleted

## 2023-03-29 ENCOUNTER — Ambulatory Visit (INDEPENDENT_AMBULATORY_CARE_PROVIDER_SITE_OTHER)

## 2023-03-29 ENCOUNTER — Ambulatory Visit (HOSPITAL_COMMUNITY)
Admission: EM | Admit: 2023-03-29 | Discharge: 2023-03-29 | Disposition: A | Discharge status: Home / Self Care | Attending: Family Medicine | Admitting: Family Medicine

## 2023-03-29 DIAGNOSIS — J4541 Moderate persistent asthma with (acute) exacerbation: Secondary | ICD-10-CM | POA: Diagnosis not present

## 2023-03-29 DIAGNOSIS — R0602 Shortness of breath: Secondary | ICD-10-CM

## 2023-03-29 MED ORDER — PREDNISONE 10 MG (48) PO TBPK: ORAL_TABLET | ORAL | 0 refills | Status: DC

## 2023-03-29 MED ORDER — IPRATROPIUM-ALBUTEROL 0.5-2.5 (3) MG/3ML IN SOLN: 3.0000 mL | Freq: Four times a day (QID) | RESPIRATORY_TRACT | 0 refills | Status: DC | PRN

## 2023-03-29 NOTE — ED Triage Notes (Signed)
 Pt states he can't get his meds due to cost (see phone encounter)  He states he has SOB, no sleep in a week due to SOB, cough, wheezing. He has been using his albuterol MDI 3-4 times a day, he uses neb when at home 4-5 times a day. Last MDI dose was couple hours ago, neb treatment this morning 1am.   Pt requesting steroids since inhalers and neb aren't working.
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
## 2023-03-30 NOTE — Telephone Encounter (Signed)
 Dr. Celine Mans, This is an update on what is going on with the patient and his Dupixent.

## 2023-06-21 ENCOUNTER — Encounter (HOSPITAL_COMMUNITY): Payer: Self-pay

## 2023-06-21 ENCOUNTER — Ambulatory Visit (HOSPITAL_COMMUNITY)
Admission: EM | Admit: 2023-06-21 | Discharge: 2023-06-21 | Disposition: A | Discharge status: Home / Self Care | Attending: Family Medicine | Admitting: Family Medicine

## 2023-06-21 DIAGNOSIS — J4541 Moderate persistent asthma with (acute) exacerbation: Secondary | ICD-10-CM

## 2023-06-21 MED ORDER — PREDNISONE 10 MG (48) PO TBPK: ORAL_TABLET | ORAL | 0 refills | Status: DC
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
## 2023-06-21 NOTE — ED Triage Notes (Signed)
 Patient reports that he has a productive cough with brown sputum that occurs mostly at night and SOB  x 2 days. Patient states he has been using his albuterol  inhaler with very little relief. Patient reports a history of asthma.  Patient states he has also used Albuterol   nebs that help for a few hours, but symptoms return.

## 2023-06-28 ENCOUNTER — Encounter: Payer: Self-pay | Admitting: Internal Medicine

## 2023-06-28 ENCOUNTER — Ambulatory Visit (INDEPENDENT_AMBULATORY_CARE_PROVIDER_SITE_OTHER): Admitting: Internal Medicine

## 2023-06-28 VITALS — BP 124/80 | HR 88 | Ht 70.0 in | Wt 241.6 lb

## 2023-06-28 DIAGNOSIS — Z87891 Personal history of nicotine dependence: Secondary | ICD-10-CM | POA: Diagnosis not present

## 2023-06-28 DIAGNOSIS — J4489 Other specified chronic obstructive pulmonary disease: Secondary | ICD-10-CM | POA: Diagnosis not present

## 2023-06-28 DIAGNOSIS — J4550 Severe persistent asthma, uncomplicated: Secondary | ICD-10-CM

## 2023-06-28 DIAGNOSIS — R768 Other specified abnormal immunological findings in serum: Secondary | ICD-10-CM

## 2023-06-28 DIAGNOSIS — K219 Gastro-esophageal reflux disease without esophagitis: Secondary | ICD-10-CM

## 2023-06-28 DIAGNOSIS — J3089 Other allergic rhinitis: Secondary | ICD-10-CM

## 2023-06-28 MED ORDER — OMEPRAZOLE 40 MG PO CPDR: 40.0000 mg | DELAYED_RELEASE_CAPSULE | Freq: Every day | ORAL | 3 refills | Status: DC

## 2023-06-28 NOTE — Patient Instructions (Addendum)
 It was a pleasure to see you today!  Please schedule follow up with myself in 3 months.  If my schedule is not open yet, we will contact you with a reminder closer to that time. Please call [phone] if you haven't heard from us  a month before, and always call us  sooner if issues or concerns arise. You can also send us  a message through MyChart, but but aware that this is not to be used for urgent issues and it may take up to 5-7 days to receive a reply. Please be aware that you will likely be able to view your results before I have a chance to respond to them. Please give us  5 business days to respond to any non-urgent results.    Continue the Trelegy inhaler 1 puff once daily Continue Airsupra  rescue inhaler as needed.  Continue DuoNeb breathing treatments as needed Your allergy testing and breathing testing is consistent with severe allergic asthma that is not well-controlled You are having very frequent exacerbations requiring prednisone  We need to get you on a biologic medication which is injectable for your asthma to get things under good control - I will try other medications.  For allergies continue flonase , montelukast , zyrtec  Your reflux is also making the asthma worse. Start taking prilosec 1 pill once a day. Follow the advice I gave you about late night eating.   What is GERD? Gastroesophageal reflux disease (GERD) is gastroesophageal reflux diseasewhich occurs when the lower esophageal sphincter (LES) opens spontaneously, for varying periods of time, or does not close properly and stomach contents rise up into the esophagus. GER is also called acid reflux or acid regurgitation, because digestive juices--called acids--rise up with the food. The esophagus is the tube that carries food from the mouth to the stomach. The LES is a ring of muscle at the bottom of the esophagus that acts like a valve between the esophagus and stomach.  When acid reflux occurs, food or fluid can be tasted  in the back of the mouth. When refluxed stomach acid touches the lining of the esophagus it may cause a burning sensation in the chest or throat called heartburn or acid indigestion. Occasional reflux is common. Persistent reflux that occurs more than twice a week is considered GERD, and it can eventually lead to more serious health problems. People of all ages can have GERD. Studies have shown that GERD may worsen or contribute to asthma, chronic cough, and pulmonary fibrosis.   What are the symptoms of GERD? The main symptom of GERD in adults is frequent heartburn, also called acid indigestion--burning-type pain in the lower part of the mid-chest, behind the breast bone, and in the mid-abdomen.  Not all reflux is acidic in nature, and many patients don't have heart burn at all. Sometimes it feels like a cough (either dry or with mucus), choking sensation, asthma, shortness of breath, waking up at night, frequent throat clearing, or trouble swallowing.    What causes GERD? The reason some people develop GERD is still unclear. However, research shows that in people with GERD, the LES relaxes while the rest of the esophagus is working. Anatomical abnormalities such as a hiatal hernia may also contribute to GERD. A hiatal hernia occurs when the upper part of the stomach and the LES move above the diaphragm, the muscle wall that separates the stomach from the chest. Normally, the diaphragm helps the LES keep acid from rising up into the esophagus. When a hiatal hernia is present, acid  reflux can occur more easily. A hiatal hernia can occur in people of any age and is most often a normal finding in otherwise healthy people over age 77. Most of the time, a hiatal hernia produces no symptoms.   Other factors that may contribute to GERD include - Obesity or recent weight gain - Pregnancy  - Smoking  - Diet - Certain medications  Common foods that can worsen reflux symptoms include: - carbonated  beverages - artificial sweeteners - citrus fruits  - chocolate  - drinks with caffeine or alcohol  - fatty and fried foods  - garlic and onions  - mint flavorings  - spicy foods  - tomato-based foods, like spaghetti sauce, salsa, chili, and pizza   Lifestyle Changes If you smoke, stop.  Avoid foods and beverages that worsen symptoms (see above.) Lose weight if needed.  Eat small, frequent meals.  Wear loose-fitting clothes.  Avoid lying down for 3 hours after a meal.  Raise the head of your bed 6 to 8 inches by securing wood blocks under the bedposts. Just using extra pillows will not help, but using a wedge-shaped pillow may be helpful.  Medications  H2 blockers, such as cimetidine (Tagamet HB), famotidine  (Pepcid  AC), nizatidine (Axid AR), and ranitidine (Zantac 75), decrease acid production. They are available in prescription strength and over-the-counter strength. These drugs provide short-term relief and are effective for about half of those who have GERD symptoms.  Proton pump inhibitors include omeprazole  (Prilosec, Zegerid), lansoprazole (Prevacid), pantoprazole  (Protonix ), rabeprazole (Aciphex), and esomeprazole (Nexium), which are available by prescription. Prilosec is also available in over-the-counter strength. Proton pump inhibitors are more effective than H2 blockers and can relieve symptoms and heal the esophageal lining in almost everyone who has GERD.  Because drugs work in different ways, combinations of medications may help control symptoms. People who get heartburn after eating may take both antacids and H2 blockers. The antacids work first to neutralize the acid in the stomach, and then the H2 blockers act on acid production. By the time the antacid stops working, the H2 blocker will have stopped acid production. Your health care provider is the best source of information about how to use medications for GERD.   Points to Remember 1. You can have GERD without having  heartburn. Your symptoms could include a dry cough, asthma symptoms, or trouble swallowing.  2. Taking medications daily as prescribed is important in controlling you symptoms.  Sometimes it can take up to 8 weeks to fully achieve the effects of the medications prescribed.  3. Coughing related to GERD can be difficult to treat and is very frustrating!  However, it is important to stick with these medications and lifestyle modifications before pursuing more aggressive or invasive test and treatments.
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
## 2023-06-29 ENCOUNTER — Other Ambulatory Visit (HOSPITAL_COMMUNITY): Payer: Self-pay

## 2023-06-29 ENCOUNTER — Telehealth: Payer: Self-pay | Admitting: Pharmacist

## 2023-06-29 NOTE — Telephone Encounter (Addendum)
 Patient's insurance does not cover any biologics - at least insurance does not pay towards the cost which leaves patient with entire cost. When I talked to patient's insurance in March 2025, I was advised that patient has to pay for medication up front and then his insurance will refund him the cost which is thousands of dollars out of pocket          Geraldene Kleine, PharmD, MPH, BCPS, CPP Clinical Pharmacist (Rheumatology and Pulmonology)  ----- Message from Aleck Hurdle sent at 06/28/2023  3:19 PM EDT ----- This patient's dupixent was not covered. Is there another biologic that is preferred?

## 2023-06-29 NOTE — Progress Notes (Signed)
 Patient's insurance does not cover any biologics - at least insurance does not pay towards the cost which leaves patient with entire cost. When I talked to patient's insurance in March 2025, I was advised that patient has to pay for medication up front and then his insurance will refund him the cost which is thousands of dollars out of pocket. Will work with pharmacy team to determine if patient is considered functionally uninsured, and if it is worth trying to get approved through patient assistance program          Geraldene Kleine, PharmD, MPH, BCPS, CPP Clinical Pharmacist (Rheumatology and Pulmonology)

## 2023-07-27 ENCOUNTER — Other Ambulatory Visit: Payer: Self-pay

## 2023-07-27 ENCOUNTER — Ambulatory Visit: Attending: Nurse Practitioner | Admitting: Occupational Therapy

## 2023-07-27 ENCOUNTER — Encounter: Payer: Self-pay | Admitting: Occupational Therapy

## 2023-07-27 DIAGNOSIS — M25631 Stiffness of right wrist, not elsewhere classified: Secondary | ICD-10-CM | POA: Insufficient documentation

## 2023-07-27 DIAGNOSIS — R29898 Other symptoms and signs involving the musculoskeletal system: Secondary | ICD-10-CM | POA: Insufficient documentation

## 2023-07-27 DIAGNOSIS — R208 Other disturbances of skin sensation: Secondary | ICD-10-CM | POA: Insufficient documentation

## 2023-07-27 DIAGNOSIS — M79641 Pain in right hand: Secondary | ICD-10-CM | POA: Diagnosis present

## 2023-07-27 DIAGNOSIS — M79642 Pain in left hand: Secondary | ICD-10-CM | POA: Diagnosis present

## 2023-07-27 DIAGNOSIS — M6281 Muscle weakness (generalized): Secondary | ICD-10-CM | POA: Insufficient documentation

## 2023-07-27 DIAGNOSIS — M25642 Stiffness of left hand, not elsewhere classified: Secondary | ICD-10-CM | POA: Insufficient documentation

## 2023-07-27 DIAGNOSIS — M25531 Pain in right wrist: Secondary | ICD-10-CM | POA: Insufficient documentation
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
## 2023-07-27 NOTE — Patient Instructions (Signed)
 SABRA

## 2023-07-28 ENCOUNTER — Ambulatory Visit (HOSPITAL_COMMUNITY)
Admission: EM | Admit: 2023-07-28 | Discharge: 2023-07-28 | Disposition: A | Discharge status: Home / Self Care | Attending: Family Medicine | Admitting: Family Medicine

## 2023-07-28 ENCOUNTER — Encounter (HOSPITAL_COMMUNITY): Payer: Self-pay | Admitting: *Deleted

## 2023-07-28 DIAGNOSIS — R1013 Epigastric pain: Secondary | ICD-10-CM

## 2023-07-28 DIAGNOSIS — K219 Gastro-esophageal reflux disease without esophagitis: Secondary | ICD-10-CM | POA: Diagnosis not present

## 2023-07-28 MED ORDER — LIDOCAINE VISCOUS HCL 2 % MT SOLN
OROMUCOSAL | Status: AC
  Filled 2023-07-28: qty 15

## 2023-07-28 MED ORDER — ALUM & MAG HYDROXIDE-SIMETH 200-200-20 MG/5ML PO SUSP
30.0000 mL | Freq: Once | ORAL | Status: AC
  Administered 2023-07-28: 30 mL via ORAL

## 2023-07-28 MED ORDER — SUCRALFATE 1 G PO TABS: 1.0000 g | ORAL_TABLET | Freq: Three times a day (TID) | ORAL | 0 refills | Status: DC | PRN

## 2023-07-28 MED ORDER — ALUM & MAG HYDROXIDE-SIMETH 200-200-20 MG/5ML PO SUSP
ORAL | Status: AC
  Filled 2023-07-28: qty 30

## 2023-07-28 MED ORDER — LIDOCAINE VISCOUS HCL 2 % MT SOLN
15.0000 mL | Freq: Once | OROMUCOSAL | Status: AC
  Administered 2023-07-28: 15 mL via OROMUCOSAL

## 2023-07-28 NOTE — Discharge Instructions (Addendum)
 EKG done today showed normal sinus rhythm.  Symptoms, history and physical exam findings are most consistent with flareup of gastroesophageal reflux disease, possibly with some mild gastritis.  We have treated with a GI cocktail today including Maalox/Mylanta and lidocaine .  We will send you home with the following prescription: Carafate  (Sucralfate ) 1 g tablet 3 times daily as needed for severe acid reflux Continue to take your Prilosec 40 mg daily. May want to consider following up with primary care or gastroenterology if symptoms persist Return to urgent care or PCP if symptoms worsen or fail to resolve.
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
## 2023-07-28 NOTE — ED Notes (Signed)
 Report provided to Dr Rolinda.

## 2023-07-28 NOTE — ED Triage Notes (Addendum)
 C/O intermittent low to mid chest pain onset 2 days ago; states belching helps it improve. Denies n/v or SOB. Pain non-reproduceable. States he does heavy lifting.

## 2023-08-04 ENCOUNTER — Ambulatory Visit: Admitting: Occupational Therapy

## 2023-08-04 DIAGNOSIS — M79641 Pain in right hand: Secondary | ICD-10-CM | POA: Diagnosis not present

## 2023-08-04 DIAGNOSIS — R208 Other disturbances of skin sensation: Secondary | ICD-10-CM

## 2023-08-04 DIAGNOSIS — M25631 Stiffness of right wrist, not elsewhere classified: Secondary | ICD-10-CM

## 2023-08-04 DIAGNOSIS — M25531 Pain in right wrist: Secondary | ICD-10-CM

## 2023-08-04 DIAGNOSIS — M25642 Stiffness of left hand, not elsewhere classified: Secondary | ICD-10-CM

## 2023-08-04 DIAGNOSIS — M79642 Pain in left hand: Secondary | ICD-10-CM
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
## 2023-08-04 NOTE — Patient Instructions (Signed)
   WEBSITE: [email or web address]

## 2023-08-15 ENCOUNTER — Ambulatory Visit: Attending: Nurse Practitioner | Admitting: Occupational Therapy

## 2023-08-15 DIAGNOSIS — M25632 Stiffness of left wrist, not elsewhere classified: Secondary | ICD-10-CM | POA: Diagnosis present

## 2023-08-15 DIAGNOSIS — M25531 Pain in right wrist: Secondary | ICD-10-CM | POA: Insufficient documentation

## 2023-08-15 DIAGNOSIS — M25631 Stiffness of right wrist, not elsewhere classified: Secondary | ICD-10-CM | POA: Insufficient documentation

## 2023-08-15 DIAGNOSIS — M6281 Muscle weakness (generalized): Secondary | ICD-10-CM | POA: Insufficient documentation

## 2023-08-15 DIAGNOSIS — M79641 Pain in right hand: Secondary | ICD-10-CM | POA: Diagnosis present

## 2023-08-15 DIAGNOSIS — R29898 Other symptoms and signs involving the musculoskeletal system: Secondary | ICD-10-CM | POA: Diagnosis present

## 2023-08-15 DIAGNOSIS — R278 Other lack of coordination: Secondary | ICD-10-CM | POA: Insufficient documentation

## 2023-08-15 DIAGNOSIS — M25641 Stiffness of right hand, not elsewhere classified: Secondary | ICD-10-CM | POA: Insufficient documentation

## 2023-08-15 DIAGNOSIS — R208 Other disturbances of skin sensation: Secondary | ICD-10-CM | POA: Diagnosis present

## 2023-08-15 DIAGNOSIS — M79642 Pain in left hand: Secondary | ICD-10-CM | POA: Insufficient documentation
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
## 2023-08-17 ENCOUNTER — Ambulatory Visit: Admitting: Occupational Therapy

## 2023-08-17 DIAGNOSIS — M25531 Pain in right wrist: Secondary | ICD-10-CM

## 2023-08-17 DIAGNOSIS — R29898 Other symptoms and signs involving the musculoskeletal system: Secondary | ICD-10-CM

## 2023-08-17 DIAGNOSIS — M6281 Muscle weakness (generalized): Secondary | ICD-10-CM

## 2023-08-17 DIAGNOSIS — R208 Other disturbances of skin sensation: Secondary | ICD-10-CM

## 2023-08-17 DIAGNOSIS — M79641 Pain in right hand: Secondary | ICD-10-CM

## 2023-08-17 NOTE — Patient Instructions (Addendum)
 Access Code: [email or web address] URL: [email or web address] Date: 08/17/2023 Prepared by: Clarita Pride  Exercises - Putty Squeezes  - 1-2 x daily - 10 reps - Rolling Putty on Table  - 1-2 x daily - 10 reps - Finger Pinch and Pull with Putty  - 1-2 x daily - 10 reps - 3-Point Pinch with Putty  - 1-2 x daily - 10 reps - Tip PUSH with Putty  - 1-2 x daily - 10 reps - Key Pinch with Putty  - 1-2 x daily - 10 reps - Finger Extension with Putty  - 1-2 x daily - 10 reps - Finger Adduction with Putty  - 1-2 x daily - 10 reps - Removing Marbles from Putty  - 1-2 x daily - 10 reps
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
## 2023-08-22 ENCOUNTER — Emergency Department (HOSPITAL_COMMUNITY)
Admission: EM | Admit: 2023-08-22 | Discharge: 2023-08-22 | Disposition: A | Discharge status: Home / Self Care | Attending: Emergency Medicine | Admitting: Emergency Medicine

## 2023-08-22 ENCOUNTER — Emergency Department (HOSPITAL_COMMUNITY)

## 2023-08-22 ENCOUNTER — Encounter (HOSPITAL_COMMUNITY): Payer: Self-pay

## 2023-08-22 ENCOUNTER — Other Ambulatory Visit: Payer: Self-pay

## 2023-08-22 DIAGNOSIS — R109 Unspecified abdominal pain: Secondary | ICD-10-CM | POA: Diagnosis present

## 2023-08-22 DIAGNOSIS — I10 Essential (primary) hypertension: Secondary | ICD-10-CM | POA: Insufficient documentation

## 2023-08-22 DIAGNOSIS — R1084 Generalized abdominal pain: Secondary | ICD-10-CM | POA: Insufficient documentation

## 2023-08-22 DIAGNOSIS — J4489 Other specified chronic obstructive pulmonary disease: Secondary | ICD-10-CM | POA: Insufficient documentation

## 2023-08-22 DIAGNOSIS — Z87891 Personal history of nicotine dependence: Secondary | ICD-10-CM | POA: Diagnosis not present

## 2023-08-22 LAB — CBC
HCT: 41 % (ref 39.0–52.0)
Hemoglobin: 13.9 g/dL (ref 13.0–17.0)
MCH: 30.2 pg (ref 26.0–34.0)
MCHC: 33.9 g/dL (ref 30.0–36.0)
MCV: 88.9 fL (ref 80.0–100.0)
Platelets: 224 K/uL (ref 150–400)
RBC: 4.61 MIL/uL (ref 4.22–5.81)
RDW: 13.3 % (ref 11.5–15.5)
WBC: 5.4 K/uL (ref 4.0–10.5)
nRBC: 0 % (ref 0.0–0.2)

## 2023-08-22 LAB — URINALYSIS, ROUTINE W REFLEX MICROSCOPIC
Bacteria, UA: NONE SEEN
Bilirubin Urine: NEGATIVE
Glucose, UA: NEGATIVE mg/dL
Hgb urine dipstick: NEGATIVE
Ketones, ur: NEGATIVE mg/dL
Nitrite: NEGATIVE
Protein, ur: NEGATIVE mg/dL
Specific Gravity, Urine: 1.021 (ref 1.005–1.030)
pH: 6 (ref 5.0–8.0)

## 2023-08-22 LAB — COMPREHENSIVE METABOLIC PANEL WITH GFR
ALT: 34 U/L (ref 0–44)
AST: 27 U/L (ref 15–41)
Albumin: 3.5 g/dL (ref 3.5–5.0)
Alkaline Phosphatase: 48 U/L (ref 38–126)
Anion gap: 10 (ref 5–15)
BUN: 12 mg/dL (ref 6–20)
CO2: 26 mmol/L (ref 22–32)
Calcium: 8.5 mg/dL — ABNORMAL LOW (ref 8.9–10.3)
Chloride: 100 mmol/L (ref 98–111)
Creatinine, Ser: 1.24 mg/dL (ref 0.61–1.24)
GFR, Estimated: >60 mL/min (ref >60)
Glucose, Bld: 112 mg/dL — ABNORMAL HIGH (ref 70–99)
Potassium: 3.3 mmol/L — ABNORMAL LOW (ref 3.5–5.1)
Sodium: 136 mmol/L (ref 135–145)
Total Bilirubin: 0.8 mg/dL (ref 0.0–1.2)
Total Protein: 6.4 g/dL — ABNORMAL LOW (ref 6.5–8.1)

## 2023-08-22 LAB — LIPASE, BLOOD: Lipase: 44 U/L (ref 11–51)

## 2023-08-22 MED ORDER — MORPHINE SULFATE (PF) 4 MG/ML IV SOLN
4.0000 mg | Freq: Once | INTRAVENOUS | Status: AC
  Administered 2023-08-22 (×2): 4 mg via INTRAVENOUS
  Filled 2023-08-22: qty 1

## 2023-08-22 MED ORDER — SODIUM CHLORIDE 0.9 % IV BOLUS
1000.0000 mL | Freq: Once | INTRAVENOUS | Status: AC
  Administered 2023-08-22 (×2): 1000 mL via INTRAVENOUS

## 2023-08-22 NOTE — ED Triage Notes (Signed)
 Pt c.o mid to right abd pain that radiates to his right flank for the past few days, last BM 2 days ago. No n.v. Pt also c.o urinary retention, states he doesn't think his bladder is fully emptying when he goes. Denies any hx of kidney stones

## 2023-08-22 NOTE — ED Notes (Signed)
 Patient transported to CT

## 2023-08-22 NOTE — Discharge Instructions (Addendum)
 You were evaluated in the Emergency Department and after careful evaluation, we did not find any emergent condition requiring admission or further testing in the hospital.  Your exam/testing today is overall reassuring.  There was no evidence of infection of your urine.  Recommend follow-up with your primary care doctor as well as urology to discuss your symptoms.  Please return to the Emergency Department if you experience any worsening of your condition.   Thank you for allowing us  to be a part of your care.

## 2023-08-22 NOTE — ED Provider Notes (Signed)
  Physical Exam  BP (!) 146/97   Pulse 63   Temp (!) 97.5 F (36.4 C) (Oral)   Resp 14   Ht 5' 11 (1.803 m)   Wt 107.5 kg   SpO2 100%   BMI 33.05 kg/m   Physical Exam Vitals and nursing note reviewed.  HENT:     Head: Normocephalic and atraumatic.  Eyes:     Pupils: Pupils are equal, round, and reactive to light.  Cardiovascular:     Rate and Rhythm: Normal rate and regular rhythm.  Pulmonary:     Effort: Pulmonary effort is normal.     Breath sounds: Normal breath sounds.  Abdominal:     Palpations: Abdomen is soft.     Tenderness: There is no abdominal tenderness.  Skin:    General: Skin is warm and dry.  Neurological:     Mental Status: He is alert.  Psychiatric:        Mood and Affect: Mood normal.     Procedures  Procedures  ED Course / MDM   Clinical Course as of 08/22/23 0750  Mon Aug 22, 2023  0750 UA is negative.  Patient has remained stable here.  He is still able to produce urine on its own.  He will follow-up with urology [MP]    Clinical Course User Index [MP] Pamella Ozell LABOR, DO   Medical Decision Making I, Ozell Pamella DO, have assumed care of this patient from the previous provider pending UA reevaluation and disposition  Amount and/or Complexity of Data Reviewed Labs: ordered. Radiology: ordered.  Risk Prescription drug management.          Pamella Ozell LABOR, DO 08/22/23 [phone]
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
## 2023-08-23 ENCOUNTER — Ambulatory Visit: Admitting: Occupational Therapy

## 2023-08-23 DIAGNOSIS — M79642 Pain in left hand: Secondary | ICD-10-CM

## 2023-08-23 DIAGNOSIS — M79641 Pain in right hand: Secondary | ICD-10-CM

## 2023-08-23 DIAGNOSIS — M25531 Pain in right wrist: Secondary | ICD-10-CM

## 2023-08-23 DIAGNOSIS — M25631 Stiffness of right wrist, not elsewhere classified: Secondary | ICD-10-CM

## 2023-08-23 DIAGNOSIS — M6281 Muscle weakness (generalized): Secondary | ICD-10-CM

## 2023-08-23 DIAGNOSIS — R208 Other disturbances of skin sensation: Secondary | ICD-10-CM
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
## 2023-08-23 NOTE — Telephone Encounter (Signed)
 [employer] patient assistance application mailed to patient's home. Prescriber form signed by Dr. Meade and scanned to [facility] for submission once patient form is returned  Sherry Pennant, PharmD, MPH, BCPS, CPP Clinical Pharmacist (Rheumatology and Pulmonology)

## 2023-08-24 ENCOUNTER — Ambulatory Visit: Admitting: Occupational Therapy

## 2023-08-24 DIAGNOSIS — M79642 Pain in left hand: Secondary | ICD-10-CM

## 2023-08-24 DIAGNOSIS — M25531 Pain in right wrist: Secondary | ICD-10-CM

## 2023-08-24 DIAGNOSIS — M6281 Muscle weakness (generalized): Secondary | ICD-10-CM

## 2023-08-24 DIAGNOSIS — R278 Other lack of coordination: Secondary | ICD-10-CM

## 2023-08-24 NOTE — Patient Instructions (Signed)
 Access Code: [email or web address] URL: [email or web address] Date: 08/24/2023 Prepared by: Clarita Pride  New Exercises  - Standing Shoulder Horizontal Abduction with Resistance  - 1 x daily - 10 reps - Standing Shoulder Diagonal Horizontal Abduction 60/120 Degrees with Resistance  - 1 x daily - 10 reps - Single Arm Shoulder Extension with Anchored Resistance  - 1 x daily - 10 reps - Standing Elbow Flexion with Resistance  - 1 x daily - 10 reps - Standing Elbow Extension with Self-Anchored Resistance  - 1 x daily - 10 reps
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
## 2023-08-29 ENCOUNTER — Ambulatory Visit: Admitting: Occupational Therapy

## 2023-08-31 ENCOUNTER — Ambulatory Visit: Admitting: Occupational Therapy

## 2023-08-31 DIAGNOSIS — R208 Other disturbances of skin sensation: Secondary | ICD-10-CM

## 2023-08-31 DIAGNOSIS — M6281 Muscle weakness (generalized): Secondary | ICD-10-CM

## 2023-08-31 DIAGNOSIS — M25531 Pain in right wrist: Secondary | ICD-10-CM | POA: Diagnosis not present

## 2023-08-31 DIAGNOSIS — M25631 Stiffness of right wrist, not elsewhere classified: Secondary | ICD-10-CM

## 2023-08-31 DIAGNOSIS — M79641 Pain in right hand: Secondary | ICD-10-CM

## 2023-08-31 DIAGNOSIS — R278 Other lack of coordination: Secondary | ICD-10-CM
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
## 2023-09-05 ENCOUNTER — Ambulatory Visit: Admitting: Occupational Therapy

## 2023-09-05 DIAGNOSIS — M6281 Muscle weakness (generalized): Secondary | ICD-10-CM

## 2023-09-05 DIAGNOSIS — R29898 Other symptoms and signs involving the musculoskeletal system: Secondary | ICD-10-CM

## 2023-09-05 DIAGNOSIS — M25531 Pain in right wrist: Secondary | ICD-10-CM | POA: Diagnosis not present

## 2023-09-05 DIAGNOSIS — M25631 Stiffness of right wrist, not elsewhere classified: Secondary | ICD-10-CM
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
## 2023-09-07 ENCOUNTER — Ambulatory Visit: Admitting: Occupational Therapy

## 2023-09-10 ENCOUNTER — Emergency Department (HOSPITAL_BASED_OUTPATIENT_CLINIC_OR_DEPARTMENT_OTHER): Admitting: Radiology

## 2023-09-10 ENCOUNTER — Emergency Department (HOSPITAL_BASED_OUTPATIENT_CLINIC_OR_DEPARTMENT_OTHER)
Admission: EM | Admit: 2023-09-10 | Discharge: 2023-09-10 | Disposition: A | Discharge status: Home / Self Care | Attending: Emergency Medicine | Admitting: Emergency Medicine

## 2023-09-10 ENCOUNTER — Encounter (HOSPITAL_BASED_OUTPATIENT_CLINIC_OR_DEPARTMENT_OTHER): Payer: Self-pay | Admitting: Emergency Medicine

## 2023-09-10 ENCOUNTER — Other Ambulatory Visit: Payer: Self-pay

## 2023-09-10 DIAGNOSIS — R062 Wheezing: Secondary | ICD-10-CM | POA: Insufficient documentation

## 2023-09-10 LAB — CBC
HCT: 40.6 % (ref 39.0–52.0)
Hemoglobin: 14 g/dL (ref 13.0–17.0)
MCH: 30.5 pg (ref 26.0–34.0)
MCHC: 34.5 g/dL (ref 30.0–36.0)
MCV: 88.5 fL (ref 80.0–100.0)
Platelets: 216 K/uL (ref 150–400)
RBC: 4.59 MIL/uL (ref 4.22–5.81)
RDW: 13.4 % (ref 11.5–15.5)
WBC: 6.6 K/uL (ref 4.0–10.5)
nRBC: 0 % (ref 0.0–0.2)

## 2023-09-10 LAB — BASIC METABOLIC PANEL WITH GFR
Anion gap: 15 (ref 5–15)
BUN: 16 mg/dL (ref 6–20)
CO2: 24 mmol/L (ref 22–32)
Calcium: 9.2 mg/dL (ref 8.9–10.3)
Chloride: 98 mmol/L (ref 98–111)
Creatinine, Ser: 1.35 mg/dL — ABNORMAL HIGH (ref 0.61–1.24)
GFR, Estimated: >60 mL/min (ref >60)
Glucose, Bld: 109 mg/dL — ABNORMAL HIGH (ref 70–99)
Potassium: 3.6 mmol/L (ref 3.5–5.1)
Sodium: 137 mmol/L (ref 135–145)

## 2023-09-10 MED ORDER — PREDNISONE 50 MG PO TABS: 50.0000 mg | ORAL_TABLET | Freq: Every day | ORAL | 0 refills | Status: DC

## 2023-09-10 MED ORDER — IPRATROPIUM BROMIDE 0.02 % IN SOLN: 0.5000 mg | Freq: Two times a day (BID) | RESPIRATORY_TRACT | 0 refills | Status: DC | PRN

## 2023-09-10 MED ORDER — IPRATROPIUM BROMIDE 0.02 % IN SOLN
0.5000 mg | Freq: Once | RESPIRATORY_TRACT | Status: AC
  Administered 2023-09-10: 0.5 mg via RESPIRATORY_TRACT
  Filled 2023-09-10: qty 2.5

## 2023-09-10 MED ORDER — PREDNISONE 50 MG PO TABS
50.0000 mg | ORAL_TABLET | Freq: Once | ORAL | Status: AC
  Administered 2023-09-10: 50 mg via ORAL
  Filled 2023-09-10: qty 1

## 2023-09-10 MED ORDER — ALBUTEROL SULFATE (2.5 MG/3ML) 0.083% IN NEBU
5.0000 mg | INHALATION_SOLUTION | Freq: Once | RESPIRATORY_TRACT | Status: AC
Start: 2023-09-10 — End: 2023-09-10
  Administered 2023-09-10: 5 mg via RESPIRATORY_TRACT
  Filled 2023-09-10: qty 6

## 2023-09-10 NOTE — ED Provider Notes (Signed)
 [place] EMERGENCY DEPARTMENT AT [facility] Provider Note   CSN: [id number] Arrival date & time: 09/10/23  1439     Patient presents with: Shortness of Breath   Stephen Miller is a 58 y.o. male.   Patient to ED for evaluation of wheezing that started earlier today. History of asthma. No fever, congestion, sore throat or other sign of acute illness. No chest pain. He used his nebulizer and inhaler prior to arrival without significant improvement.   The history is provided by the patient and the spouse. No language interpreter was used.  Shortness of Breath      Prior to Admission medications   Medication Sig Start Date End Date Taking? Authorizing Provider  ipratropium (ATROVENT ) 0.02 % nebulizer solution Take 2.5 mLs (0.5 mg total) by nebulization 2 (two) times daily as needed for wheezing or shortness of breath. 09/10/23  Yes Odell Balls, PA-C  predniSONE  (DELTASONE ) 50 MG tablet Take 1 tablet (50 mg total) by mouth daily. 09/11/23  Yes Bronnie Vasseur, Balls, PA-C  albuterol  (VENTOLIN  HFA) 108 (90 Base) MCG/ACT inhaler Inhale 2 puffs into the lungs every 6 (six) hours as needed for wheezing or shortness of breath. 01/20/23   Parrett, Madelin RAMAN, NP  Albuterol -Budesonide  (AIRSUPRA ) 90-80 MCG/ACT AERO Inhale 2 puffs into the lungs every 4 (four) hours as needed. 03/22/23   Desai, Nikita S, MD  BAYER LOW DOSE 81 MG EC tablet Take 81 mg by mouth daily. Swallow whole.    [provider]  cetirizine  (ZYRTEC ) 10 MG tablet Take 1 tablet (10 mg total) by mouth daily. 03/22/23   Desai, Nikita S, MD  fluticasone  (FLONASE ) 50 MCG/ACT nasal spray Place 1 spray into both nostrils in the morning and at bedtime. 03/28/23   Desai, Nikita S, MD  Fluticasone -Umeclidin-Vilant (TRELEGY ELLIPTA ) 100-62.5-25 MCG/ACT AEPB Inhale 1 puff into the lungs.    [provider]  hydrochlorothiazide  (HYDRODIURIL ) 25 MG tablet Take by mouth. 01/27/22   [provider]  ipratropium-albuterol   (DUONEB) 0.5-2.5 (3) MG/3ML SOLN Inhale 3 mLs into the lungs every 6 (six) hours as needed. 03/29/23   Rolinda Rogue, MD  montelukast  (SINGULAIR ) 10 MG tablet Take 1 tablet (10 mg total) by mouth at bedtime. 03/22/23   Meade Verdon RAMAN, MD  omeprazole  (PRILOSEC) 40 MG capsule Take 1 capsule (40 mg total) by mouth daily. 06/28/23   Desai, Nikita S, MD  sucralfate  (CARAFATE ) 1 g tablet Take 1 tablet (1 g total) by mouth 3 (three) times daily as needed. 07/28/23   White, Elizabeth A, PA-C  tadalafil (CIALIS) 10 MG tablet Take 10 mg by mouth as needed.    [provider]  budesonide -formoterol  (SYMBICORT ) 80-4.5 MCG/ACT inhaler Inhale 2 puffs into the lungs 2 (two) times daily. Patient not taking: Reported on 10/24/2017 08/28/16 08/21/19  Dineen Rollene MATSU, FNP    Allergies: Iodinated contrast media, Ketorolac  tromethamine , Other, and Tramadol    Review of Systems  Respiratory:  Positive for shortness of breath.     Updated Vital Signs BP 134/76 (BP Location: Right Arm)   Pulse 92   Temp 98 F (36.7 C)   Resp 18   SpO2 99%   Physical Exam Vitals and nursing note reviewed.  Constitutional:      Appearance: He is well-developed.  HENT:     Head: Normocephalic.  Cardiovascular:     Rate and Rhythm: Normal rate and regular rhythm.     Heart sounds: No murmur heard. Pulmonary:     Effort:  Pulmonary effort is normal.     Breath sounds: Decreased breath sounds and wheezing present. No rhonchi or rales.  Abdominal:     General: Bowel sounds are normal.     Palpations: Abdomen is soft.     Tenderness: There is no abdominal tenderness. There is no guarding or rebound.  Musculoskeletal:        General: Normal range of motion.     Cervical back: Normal range of motion and neck supple.     Right lower leg: No edema.     Left lower leg: No edema.  Skin:    General: Skin is warm and dry.  Neurological:     General: No focal deficit present.     Mental Status: He is alert and oriented  to person, place, and time.     (all labs ordered are listed, but only abnormal results are displayed) Labs Reviewed  BASIC METABOLIC PANEL WITH GFR - Abnormal; Notable for the following components:      Result Value   Glucose, Bld 109 (*)    Creatinine, Ser 1.35 (*)    All other components within normal limits  CBC   Results for orders placed or performed during the hospital encounter of 09/10/23  Basic metabolic panel   Collection Time: 09/10/23  3:09 PM  Result Value Ref Range   Sodium 137 135 - 145 mmol/L   Potassium 3.6 3.5 - 5.1 mmol/L   Chloride 98 98 - 111 mmol/L   CO2 24 22 - 32 mmol/L   Glucose, Bld 109 (H) 70 - 99 mg/dL   BUN 16 6 - 20 mg/dL   Creatinine, Ser 8.64 (H) 0.61 - 1.24 mg/dL   Calcium 9.2 8.9 - 89.6 mg/dL   GFR, Estimated >39 >39 mL/min   Anion gap 15 5 - 15  CBC   Collection Time: 09/10/23  3:09 PM  Result Value Ref Range   WBC 6.6 4.0 - 10.5 K/uL   RBC 4.59 4.22 - 5.81 MIL/uL   Hemoglobin 14.0 13.0 - 17.0 g/dL   HCT 59.3 60.9 - 47.9 %   MCV 88.5 80.0 - 100.0 fL   MCH 30.5 26.0 - 34.0 pg   MCHC 34.5 30.0 - 36.0 g/dL   RDW 86.5 88.4 - 84.4 %   Platelets 216 150 - 400 K/uL   nRBC 0.0 0.0 - 0.2 %    EKG: None  Radiology: No results found.   Procedures   Medications Ordered in the ED  albuterol  (PROVENTIL ) (2.5 MG/3ML) 0.083% nebulizer solution 5 mg (5 mg Nebulization Given 09/10/23 1503)  ipratropium (ATROVENT ) nebulizer solution 0.5 mg (0.5 mg Nebulization Given 09/10/23 1504)  predniSONE  (DELTASONE ) tablet 50 mg (50 mg Oral Given 09/10/23 1636)    Clinical Course as of 09/10/23 1649  Sat Sep 10, 2023  1629 Patient to ED with wheezing, history of asthma. No fever, no ss/sxs acute illness. He is wheezing on arrival and provided a duo neb with 5 mg Albuterol  and 0.5 mg Atrovent . On re-examination, his wheezing is significantly improved. Prednisone  provided. Discussed home use of nebulized atrovent . No hypoxia. He is felt appropriate for  discharge home. Strict return precautions discussed.  [SU]  1645 On final exam, the patient feels improved and is comfortable with discharge.  [SU]    Clinical Course User Index [SU] Odell Balls, PA-C  Medical Decision Making Amount and/or Complexity of Data Reviewed Labs: ordered.  Risk Prescription drug management.        Final diagnoses:  Wheezing    ED Discharge Orders          Ordered    predniSONE  (DELTASONE ) 50 MG tablet  Daily        09/10/23 1612    ipratropium (ATROVENT ) 0.02 % nebulizer solution  2 times daily PRN        09/10/23 1612               Odell Balls, PA-C 09/10/23 1649    Pamella Sharper A, DO 09/16/23 1140

## 2023-09-10 NOTE — Discharge Instructions (Addendum)
 Take the prednisone  as prescribed for the next 3 days. Continue your albuterol  nebulizers as needed and you can add atrovent  as discussed. Return to the ED with any concerning shortness of breath or uncontrolled wheezing at any time.

## 2023-09-10 NOTE — Telephone Encounter (Signed)
 Pt currently at [facility], requesting steroid, states his MD has called in before. Called on call, who states pt needs to be seen. Will get seen at [facility], urged him to let the nursing staff know he is having diff breathing to get faster eval.  Reason for Disposition . [1] MODERATE asthma attack (e.g., SOB at rest, speaks in phrases, audible wheezes) AND [2] not resolved after 2 or 3 inhaler or nebulizer treatments given 20 minutes apart  Protocols used: Asthma Attack-A-AH

## 2023-09-10 NOTE — ED Triage Notes (Signed)
 Sob , trouble breathing started last night  I think its my asthma Took 2 breathing treatments and inhaler without results

## 2023-09-13 ENCOUNTER — Ambulatory Visit: Attending: Nurse Practitioner | Admitting: Occupational Therapy

## 2023-09-14 ENCOUNTER — Encounter: Admitting: Occupational Therapy

## 2023-09-19 ENCOUNTER — Ambulatory Visit: Admitting: Occupational Therapy

## 2023-09-26 ENCOUNTER — Other Ambulatory Visit: Payer: Self-pay

## 2023-09-26 ENCOUNTER — Encounter (HOSPITAL_COMMUNITY): Payer: Self-pay | Admitting: *Deleted

## 2023-09-26 ENCOUNTER — Ambulatory Visit (HOSPITAL_COMMUNITY)
Admission: EM | Admit: 2023-09-26 | Discharge: 2023-09-26 | Disposition: A | Discharge status: Home / Self Care | Attending: Emergency Medicine | Admitting: Emergency Medicine

## 2023-09-26 DIAGNOSIS — J069 Acute upper respiratory infection, unspecified: Secondary | ICD-10-CM

## 2023-09-26 DIAGNOSIS — J4521 Mild intermittent asthma with (acute) exacerbation: Secondary | ICD-10-CM

## 2023-09-26 LAB — POC COVID19/FLU A&B COMBO
Covid Antigen, POC: NEGATIVE
Influenza A Antigen, POC: NEGATIVE
Influenza B Antigen, POC: NEGATIVE

## 2023-09-26 MED ORDER — PREDNISONE 20 MG PO TABS
40.0000 mg | ORAL_TABLET | Freq: Every day | ORAL | 0 refills | Status: AC
Start: 2023-09-27 — End: 2023-10-02

## 2023-09-26 MED ORDER — METHYLPREDNISOLONE SODIUM SUCC 125 MG IJ SOLR
INTRAMUSCULAR | Status: AC
  Filled 2023-09-26: qty 2

## 2023-09-26 MED ORDER — IPRATROPIUM-ALBUTEROL 0.5-2.5 (3) MG/3ML IN SOLN
3.0000 mL | Freq: Once | RESPIRATORY_TRACT | Status: AC
  Administered 2023-09-26: 3 mL via RESPIRATORY_TRACT

## 2023-09-26 MED ORDER — IPRATROPIUM-ALBUTEROL 0.5-2.5 (3) MG/3ML IN SOLN
RESPIRATORY_TRACT | Status: AC
  Filled 2023-09-26: qty 3

## 2023-09-26 MED ORDER — METHYLPREDNISOLONE SODIUM SUCC 125 MG IJ SOLR
60.0000 mg | Freq: Once | INTRAMUSCULAR | Status: AC
  Administered 2023-09-26: 60 mg via INTRAMUSCULAR

## 2023-09-26 MED ORDER — PROMETHAZINE-DM 6.25-15 MG/5ML PO SYRP: 5.0000 mL | ORAL_SOLUTION | Freq: Four times a day (QID) | ORAL | 0 refills | Status: DC | PRN

## 2023-09-26 NOTE — Discharge Instructions (Addendum)
 Flu and covid testing today was negative You may have a different respiratory virus that caused an exacerbation of your asthma/COPD.  Please use your nebulizer machine (or inhaler) every 6 hours for the next several days. Then continue as needed.   Starting tomorrow, begin the prednisone  burst.   The promethazine  DM cough syrup can be used up to 4 times daily. If this medication makes you drowsy, take only once before bed.  Please go to the emergency department if symptoms worsen.
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
## 2023-09-26 NOTE — ED Triage Notes (Addendum)
 PT reports his grandson was at his house Friday and was sick. Pt now has SHOB and wheezing . Pt called his PCP who can not see him till Tuesday. Pt's Neb machine broke over the weekend and He has only had  rescue inhaler to use. PT reports [facility] makes his chest tight.

## 2023-09-27 ENCOUNTER — Ambulatory Visit: Admitting: Orthopedic Surgery

## 2023-11-14 ENCOUNTER — Encounter: Payer: Self-pay | Admitting: Radiology

## 2023-11-29 ENCOUNTER — Encounter (HOSPITAL_COMMUNITY): Payer: Self-pay

## 2023-11-29 ENCOUNTER — Ambulatory Visit (INDEPENDENT_AMBULATORY_CARE_PROVIDER_SITE_OTHER)

## 2023-11-29 ENCOUNTER — Ambulatory Visit (HOSPITAL_COMMUNITY)
Admission: EM | Admit: 2023-11-29 | Discharge: 2023-11-29 | Disposition: A | Discharge status: Home / Self Care | Attending: Family Medicine | Admitting: Family Medicine

## 2023-11-29 DIAGNOSIS — J4521 Mild intermittent asthma with (acute) exacerbation: Secondary | ICD-10-CM | POA: Diagnosis not present

## 2023-11-29 DIAGNOSIS — J4550 Severe persistent asthma, uncomplicated: Secondary | ICD-10-CM | POA: Diagnosis not present

## 2023-11-29 MED ORDER — ALBUTEROL SULFATE HFA 108 (90 BASE) MCG/ACT IN AERS: 2.0000 | INHALATION_SPRAY | Freq: Four times a day (QID) | RESPIRATORY_TRACT | 2 refills | Status: DC | PRN

## 2023-11-29 MED ORDER — IPRATROPIUM-ALBUTEROL 0.5-2.5 (3) MG/3ML IN SOLN
RESPIRATORY_TRACT | Status: AC
  Filled 2023-11-29: qty 3

## 2023-11-29 MED ORDER — METHYLPREDNISOLONE SODIUM SUCC 125 MG IJ SOLR
125.0000 mg | Freq: Once | INTRAMUSCULAR | Status: AC
  Administered 2023-11-29: 125 mg via INTRAMUSCULAR

## 2023-11-29 MED ORDER — IPRATROPIUM-ALBUTEROL 0.5-2.5 (3) MG/3ML IN SOLN
3.0000 mL | Freq: Once | RESPIRATORY_TRACT | Status: AC
  Administered 2023-11-29: 3 mL via RESPIRATORY_TRACT

## 2023-11-29 MED ORDER — METHYLPREDNISOLONE SODIUM SUCC 125 MG IJ SOLR
INTRAMUSCULAR | Status: AC
  Filled 2023-11-29: qty 2

## 2023-11-29 MED ORDER — ALBUTEROL SULFATE HFA 108 (90 BASE) MCG/ACT IN AERS
2.0000 | INHALATION_SPRAY | RESPIRATORY_TRACT | Status: DC
  Administered 2023-11-29: 2 via RESPIRATORY_TRACT

## 2023-11-29 MED ORDER — ALBUTEROL SULFATE HFA 108 (90 BASE) MCG/ACT IN AERS
INHALATION_SPRAY | RESPIRATORY_TRACT | Status: AC
  Filled 2023-11-29: qty 6.7

## 2023-11-29 MED ORDER — PREDNISONE 50 MG PO TABS: ORAL_TABLET | ORAL | 0 refills | Status: DC
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
## 2023-11-29 NOTE — Discharge Instructions (Addendum)
 Return if any problems.

## 2023-11-29 NOTE — ED Triage Notes (Signed)
 Pt c/o cough since Saturday. States his chest is sore and feels SOB. States hx of copd and asthma. States out of his inhaler. Pt speaking in complete sentences. NAD.

## 2023-12-18 ENCOUNTER — Other Ambulatory Visit: Payer: Self-pay

## 2023-12-18 ENCOUNTER — Observation Stay (HOSPITAL_BASED_OUTPATIENT_CLINIC_OR_DEPARTMENT_OTHER)
Admission: EM | Admit: 2023-12-18 | Discharge: 2023-12-21 | Disposition: A | Attending: Internal Medicine | Admitting: Internal Medicine

## 2023-12-18 ENCOUNTER — Encounter (HOSPITAL_BASED_OUTPATIENT_CLINIC_OR_DEPARTMENT_OTHER): Payer: Self-pay | Admitting: Emergency Medicine

## 2023-12-18 ENCOUNTER — Emergency Department (HOSPITAL_BASED_OUTPATIENT_CLINIC_OR_DEPARTMENT_OTHER): Admitting: Radiology

## 2023-12-18 DIAGNOSIS — J441 Chronic obstructive pulmonary disease with (acute) exacerbation: Principal | ICD-10-CM | POA: Diagnosis present

## 2023-12-18 DIAGNOSIS — Z743 Need for continuous supervision: Secondary | ICD-10-CM | POA: Diagnosis not present

## 2023-12-18 DIAGNOSIS — R0602 Shortness of breath: Secondary | ICD-10-CM | POA: Diagnosis present

## 2023-12-18 DIAGNOSIS — Z8679 Personal history of other diseases of the circulatory system: Secondary | ICD-10-CM

## 2023-12-18 DIAGNOSIS — R059 Cough, unspecified: Secondary | ICD-10-CM | POA: Diagnosis not present

## 2023-12-18 LAB — BASIC METABOLIC PANEL WITH GFR
Anion gap: 11 (ref 5–15)
BUN: 13 mg/dL (ref 6–20)
CO2: 29 mmol/L (ref 22–32)
Calcium: 9.2 mg/dL (ref 8.9–10.3)
Chloride: 102 mmol/L (ref 98–111)
Creatinine, Ser: 1.31 mg/dL — ABNORMAL HIGH (ref 0.61–1.24)
GFR, Estimated: >60 mL/min (ref >60)
Glucose, Bld: 105 mg/dL — ABNORMAL HIGH (ref 70–99)
Potassium: 3.9 mmol/L (ref 3.5–5.1)
Sodium: 141 mmol/L (ref 135–145)

## 2023-12-18 LAB — RESP PANEL BY RT-PCR (RSV, FLU A&B, COVID)  RVPGX2
Influenza A by PCR: NEGATIVE
Influenza B by PCR: NEGATIVE
Resp Syncytial Virus by PCR: NEGATIVE
SARS Coronavirus 2 by RT PCR: NEGATIVE

## 2023-12-18 LAB — CBC
HCT: 40.9 % (ref 39.0–52.0)
Hemoglobin: 14 g/dL (ref 13.0–17.0)
MCH: 29.8 pg (ref 26.0–34.0)
MCHC: 34.2 g/dL (ref 30.0–36.0)
MCV: 87 fL (ref 80.0–100.0)
Platelets: 224 K/uL (ref 150–400)
RBC: 4.7 MIL/uL (ref 4.22–5.81)
RDW: 13 % (ref 11.5–15.5)
WBC: 6.4 K/uL (ref 4.0–10.5)
nRBC: 0 % (ref 0.0–0.2)

## 2023-12-18 MED ORDER — ACETAMINOPHEN 650 MG RE SUPP: 650.0000 mg | Freq: Four times a day (QID) | RECTAL | Status: DC | PRN

## 2023-12-18 MED ORDER — ONDANSETRON HCL 4 MG/2ML IJ SOLN: 4.0000 mg | Freq: Four times a day (QID) | INTRAMUSCULAR | Status: DC | PRN

## 2023-12-18 MED ORDER — IPRATROPIUM-ALBUTEROL 0.5-2.5 (3) MG/3ML IN SOLN
3.0000 mL | Freq: Four times a day (QID) | RESPIRATORY_TRACT | Status: DC
  Administered 2023-12-19 (×3): 3 mL via RESPIRATORY_TRACT
  Filled 2023-12-18 (×3): qty 3

## 2023-12-18 MED ORDER — IPRATROPIUM-ALBUTEROL 0.5-2.5 (3) MG/3ML IN SOLN
3.0000 mL | Freq: Once | RESPIRATORY_TRACT | Status: AC
  Administered 2023-12-18: 3 mL via RESPIRATORY_TRACT
  Filled 2023-12-18: qty 3

## 2023-12-18 MED ORDER — METHYLPREDNISOLONE SODIUM SUCC 125 MG IJ SOLR
80.0000 mg | Freq: Two times a day (BID) | INTRAMUSCULAR | Status: DC
  Administered 2023-12-19 – 2023-12-20 (×3): 80 mg via INTRAVENOUS
  Filled 2023-12-18 (×3): qty 2

## 2023-12-18 MED ORDER — ALBUTEROL SULFATE (2.5 MG/3ML) 0.083% IN NEBU
INHALATION_SOLUTION | RESPIRATORY_TRACT | Status: AC
  Administered 2023-12-18: 2.5 mg
  Filled 2023-12-18: qty 3

## 2023-12-18 MED ORDER — BENZONATATE 100 MG PO CAPS
200.0000 mg | ORAL_CAPSULE | Freq: Three times a day (TID) | ORAL | Status: DC | PRN
  Administered 2023-12-20: 200 mg via ORAL
  Filled 2023-12-18: qty 2

## 2023-12-18 MED ORDER — ONDANSETRON HCL 4 MG/2ML IJ SOLN
4.0000 mg | Freq: Once | INTRAMUSCULAR | Status: AC
  Administered 2023-12-18: 4 mg via INTRAVENOUS

## 2023-12-18 MED ORDER — ALBUTEROL SULFATE (2.5 MG/3ML) 0.083% IN NEBU: 2.5000 mg | INHALATION_SOLUTION | Freq: Once | RESPIRATORY_TRACT | Status: DC

## 2023-12-18 MED ORDER — MELATONIN 3 MG PO TABS
3.0000 mg | ORAL_TABLET | Freq: Every evening | ORAL | Status: DC | PRN
  Administered 2023-12-20: 3 mg via ORAL
  Filled 2023-12-18: qty 1

## 2023-12-18 MED ORDER — ACETAMINOPHEN 325 MG PO TABS: 650.0000 mg | ORAL_TABLET | Freq: Four times a day (QID) | ORAL | Status: DC | PRN

## 2023-12-18 MED ORDER — ALBUTEROL SULFATE HFA 108 (90 BASE) MCG/ACT IN AERS: 2.0000 | INHALATION_SPRAY | RESPIRATORY_TRACT | Status: DC | PRN

## 2023-12-18 MED ORDER — MONTELUKAST SODIUM 10 MG PO TABS
10.0000 mg | ORAL_TABLET | Freq: Every day | ORAL | Status: DC
  Administered 2023-12-19 – 2023-12-20 (×3): 10 mg via ORAL
  Filled 2023-12-18 (×3): qty 1

## 2023-12-18 MED ORDER — ALBUTEROL SULFATE (2.5 MG/3ML) 0.083% IN NEBU
2.5000 mg | INHALATION_SOLUTION | RESPIRATORY_TRACT | Status: DC | PRN
  Administered 2023-12-20: 2.5 mg via RESPIRATORY_TRACT
  Filled 2023-12-18: qty 3

## 2023-12-18 MED ORDER — ALBUTEROL SULFATE (2.5 MG/3ML) 0.083% IN NEBU
5.0000 mg | INHALATION_SOLUTION | Freq: Once | RESPIRATORY_TRACT | Status: AC
  Administered 2023-12-18: 5 mg via RESPIRATORY_TRACT
  Filled 2023-12-18: qty 6

## 2023-12-18 MED ORDER — SODIUM CHLORIDE 0.9 % IV SOLN
500.0000 mg | Freq: Every day | INTRAVENOUS | Status: DC
  Administered 2023-12-19 (×2): 500 mg via INTRAVENOUS
  Filled 2023-12-18 (×2): qty 5

## 2023-12-18 MED ORDER — IPRATROPIUM BROMIDE 0.02 % IN SOLN
0.5000 mg | Freq: Once | RESPIRATORY_TRACT | Status: AC
  Administered 2023-12-18: 0.5 mg via RESPIRATORY_TRACT
  Filled 2023-12-18: qty 2.5

## 2023-12-18 MED ORDER — METHYLPREDNISOLONE SODIUM SUCC 125 MG IJ SOLR
125.0000 mg | Freq: Once | INTRAMUSCULAR | Status: AC
  Administered 2023-12-18: 125 mg via INTRAVENOUS
  Filled 2023-12-18: qty 2

## 2023-12-18 NOTE — ED Provider Notes (Signed)
 [place] EMERGENCY DEPARTMENT AT [facility] Provider Note   CSN: [id number] Arrival date & time: 12/18/23  1536     Patient presents with: Shortness of Breath   Kalix Meinecke is a 58 y.o. male.   Patient to ED with c/o SOB, cough, worsening over the last 2 days. No fever. He has a history of asthma/COPD, HTN. He has a nebulizer machine at home, as well as inhaler, but reports these are providing no relief. He denies chest pain, nausea, vomiting. He is exposed to secondhand smoke in the home.   The history is provided by the patient. No language interpreter was used.  Shortness of Breath      Prior to Admission medications   Medication Sig Start Date End Date Taking? Authorizing Provider  albuterol  (VENTOLIN  HFA) 108 (90 Base) MCG/ACT inhaler Inhale 2 puffs into the lungs every 6 (six) hours as needed for wheezing or shortness of breath. 11/29/23   Sofia, Leslie K, PA-C  Albuterol -Budesonide  (AIRSUPRA ) 90-80 MCG/ACT AERO Inhale 2 puffs into the lungs every 4 (four) hours as needed. 03/22/23   Desai, Nikita S, MD  BAYER LOW DOSE 81 MG EC tablet Take 81 mg by mouth daily. Swallow whole.    [provider]  cetirizine  (ZYRTEC ) 10 MG tablet Take 1 tablet (10 mg total) by mouth daily. 03/22/23   Meade Verdon RAMAN, MD  fluticasone  (FLONASE ) 50 MCG/ACT nasal spray Place 1 spray into both nostrils in the morning and at bedtime. 03/28/23   Desai, Nikita S, MD  Fluticasone -Umeclidin-Vilant (TRELEGY ELLIPTA ) 100-62.5-25 MCG/ACT AEPB Inhale 1 puff into the lungs.    [provider]  hydrochlorothiazide  (HYDRODIURIL ) 25 MG tablet Take by mouth. 01/27/22   [provider]  ipratropium (ATROVENT ) 0.02 % nebulizer solution Take 2.5 mLs (0.5 mg total) by nebulization 2 (two) times daily as needed for wheezing or shortness of breath. 09/10/23   Odell Balls, PA-C  ipratropium-albuterol  (DUONEB) 0.5-2.5 (3) MG/3ML SOLN Inhale 3 mLs into the lungs every 6 (six) hours  as needed. 03/29/23   Rolinda Rogue, MD  montelukast  (SINGULAIR ) 10 MG tablet Take 1 tablet (10 mg total) by mouth at bedtime. 03/22/23   Meade Verdon RAMAN, MD  omeprazole  (PRILOSEC) 40 MG capsule Take 1 capsule (40 mg total) by mouth daily. 06/28/23   Desai, Nikita S, MD  predniSONE  (DELTASONE ) 50 MG tablet One tablet a day for 5 days 11/29/23   Sofia, Leslie K, PA-C  sucralfate  (CARAFATE ) 1 g tablet Take 1 tablet (1 g total) by mouth 3 (three) times daily as needed. 07/28/23   White, Elizabeth A, PA-C  tadalafil (CIALIS) 10 MG tablet Take 10 mg by mouth as needed.    [provider]  budesonide -formoterol  (SYMBICORT ) 80-4.5 MCG/ACT inhaler Inhale 2 puffs into the lungs 2 (two) times daily. Patient not taking: Reported on 10/24/2017 08/28/16 08/21/19  Dineen Rollene MATSU, FNP    Allergies: Iodinated contrast media, Ketorolac  tromethamine , Other, and Tramadol    Review of Systems  Respiratory:  Positive for shortness of breath.     Updated Vital Signs BP (!) 149/92   Pulse 93   Temp 98.6 F (37 C) (Oral)   Resp 15   Ht 5' 11 (1.803 m)   Wt 111.6 kg   SpO2 99%   BMI 34.31 kg/m   Physical Exam Vitals and nursing note reviewed.  Constitutional:      Appearance: He is well-developed.  HENT:     Head: Normocephalic.  Cardiovascular:  Rate and Rhythm: Normal rate and regular rhythm.     Heart sounds: No murmur heard. Pulmonary:     Effort: No respiratory distress.     Breath sounds: Wheezing (Inspirtory and Expiratory wheezing throughout) present. No rhonchi or rales.     Comments: Tachypneic on arrival. Abdominal:     General: Bowel sounds are normal.     Palpations: Abdomen is soft.     Tenderness: There is no abdominal tenderness. There is no guarding or rebound.  Musculoskeletal:        General: Normal range of motion.     Cervical back: Normal range of motion and neck supple.  Skin:    General: Skin is warm and dry.  Neurological:     General: No focal deficit  present.     Mental Status: He is alert and oriented to person, place, and time.     (all labs ordered are listed, but only abnormal results are displayed) Labs Reviewed  BASIC METABOLIC PANEL WITH GFR - Abnormal; Notable for the following components:      Result Value   Glucose, Bld 105 (*)    Creatinine, Ser 1.31 (*)    All other components within normal limits  RESP PANEL BY RT-PCR (RSV, FLU A&B, COVID)  [id number]  CBC    EKG: EKG Interpretation Date/Time:  Sunday December 18 2023 15:53:12 EST Ventricular Rate:  84 PR Interval:  149 QRS Duration:  100 QT Interval:  395 QTC Calculation: 467 R Axis:   39  Text Interpretation: Sinus rhythm Confirmed by Ula Barter [phone]) on 12/18/2023 3:57:12 PM  Radiology: [facility] Chest Port 1 View Result Date: 12/18/2023 EXAM: 1 VIEW(S) XRAY OF THE CHEST 12/18/2023 04:02:42 PM COMPARISON: 19 days ago. CLINICAL HISTORY: SOB (shortness of breath). FINDINGS: LUNGS AND PLEURA: No focal pulmonary opacity. No pleural effusion. No pneumothorax. HEART AND MEDIASTINUM: No acute abnormality of the cardiac and mediastinal silhouettes. BONES AND SOFT TISSUES: No acute osseous abnormality. IMPRESSION: 1. No acute cardiopulmonary process. Electronically signed by: Lynwood Seip MD 12/18/2023 04:20 PM EST RP Workstation: [id number]     Procedures   Medications Ordered in the ED  ipratropium-albuterol  (DUONEB) 0.5-2.5 (3) MG/3ML nebulizer solution 3 mL (3 mLs Nebulization Given 12/18/23 1548)  methylPREDNISolone  sodium succinate (SOLU-MEDROL ) 125 mg/2 mL injection 125 mg (125 mg Intravenous Given 12/18/23 1606)  albuterol  (PROVENTIL ) (2.5 MG/3ML) 0.083% nebulizer solution 5 mg (5 mg Nebulization Given 12/18/23 1617)  ondansetron  (ZOFRAN ) injection 4 mg (4 mg Intravenous Given 12/18/23 1611)  albuterol  (PROVENTIL ) (2.5 MG/3ML) 0.083% nebulizer solution (2.5 mg  Given 12/18/23 1627)  albuterol  (PROVENTIL ) (2.5 MG/3ML) 0.083% nebulizer solution 5 mg (5 mg Nebulization  Given 12/18/23 1647)  ipratropium (ATROVENT ) nebulizer solution 0.5 mg (0.5 mg Nebulization Given 12/18/23 1649)    Clinical Course as of 12/18/23 1859  Sun Dec 18, 2023  1819 Patient to ED with uncontrolled wheezing that started 2 days ago and is worsening. Smoker until 9 months ago, still exposed to 2nd hand smoke in the house. No fever. No chest pain.   He has been given 2 duoNebs and a Albuterol  only neb. Wheezing is improved but he still has expiratory wheezing bilaterally. Ambulatory pulse ox obtained and is 91-94%, but become significantly tachycardic.   Feel he would benefit from admission for continued albuterol  treatments and steroid dosing.  [SU]  1858 Discussed with Dr. Silvester who accepts the patient to her service.  [SU]    Clinical Course User Index [SU] Odell Balls, PA-C  Medical Decision Making Amount and/or Complexity of Data Reviewed Labs: ordered. Radiology: ordered.  Risk Prescription drug management.        Final diagnoses:  COPD with acute exacerbation [facility])    ED Discharge Orders     None          Odell Balls, PA-C 12/18/23 1859    Ula Prentice SAUNDERS, MD 12/18/23 [phone]
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
## 2023-12-18 NOTE — ED Notes (Signed)
 Pt Spo2 91-94% while ambulating, HR 160-180, audible wheezing continued, Shari PA made aware.

## 2023-12-18 NOTE — Plan of Care (Addendum)
 58 yo male with hx of COPD with SOB for 2 days presents with SOB and wheezing noted to have HR 160- 180 when ambulating ? No fever, no CP  Given Duoneb, solumedrol, albuterol  neb x2, atrovent  CXR non acute Quit smoking 9 m ago Still wheezing  O2 sat drops to 91% with walking and becomes tachycardic Now NSR 88 Not on O2 No leg pain no swelling Cr 1.3  Admit to progressive given episodes of tachycardia Stephen Miller 6:58 PM  Notified by ER provider that now that patient walks his heart rate went up only to 105 vitals appears to be more stable Change better telemetry 7:07 PM

## 2023-12-18 NOTE — ED Triage Notes (Signed)
 Pt voa POV c/o SOB x 2 days, no relief after nebs at home. Audible wheezing w/o stethoscope. Pt has hx asthma and chronic bronchitis. Inspiratory and expiratory wheezes noted bilaterally. RT requested to eval in triage room.

## 2023-12-19 DIAGNOSIS — J441 Chronic obstructive pulmonary disease with (acute) exacerbation: Secondary | ICD-10-CM | POA: Diagnosis present

## 2023-12-19 LAB — COMPREHENSIVE METABOLIC PANEL WITH GFR
ALT: 33 U/L (ref 0–44)
AST: 30 U/L (ref 15–41)
Albumin: 4.3 g/dL (ref 3.5–5.0)
Alkaline Phosphatase: 71 U/L (ref 38–126)
Anion gap: 17 — ABNORMAL HIGH (ref 5–15)
BUN: 17 mg/dL (ref 6–20)
CO2: 22 mmol/L (ref 22–32)
Calcium: 9.2 mg/dL (ref 8.9–10.3)
Chloride: 99 mmol/L (ref 98–111)
Creatinine, Ser: 1.21 mg/dL (ref 0.61–1.24)
GFR, Estimated: >60 mL/min (ref >60)
Glucose, Bld: 180 mg/dL — ABNORMAL HIGH (ref 70–99)
Potassium: 3.3 mmol/L — ABNORMAL LOW (ref 3.5–5.1)
Sodium: 137 mmol/L (ref 135–145)
Total Bilirubin: 0.4 mg/dL (ref 0.0–1.2)
Total Protein: 7.1 g/dL (ref 6.5–8.1)

## 2023-12-19 LAB — CBC WITH DIFFERENTIAL/PLATELET
Abs Immature Granulocytes: 0.03 K/uL (ref 0.00–0.07)
Basophils Absolute: 0 K/uL (ref 0.0–0.1)
Basophils Relative: 0 %
Eosinophils Absolute: 0 K/uL (ref 0.0–0.5)
Eosinophils Relative: 0 %
HCT: 40.8 % (ref 39.0–52.0)
Hemoglobin: 13.6 g/dL (ref 13.0–17.0)
Immature Granulocytes: 0 %
Lymphocytes Relative: 6 %
Lymphs Abs: 0.5 K/uL — ABNORMAL LOW (ref 0.7–4.0)
MCH: 30.1 pg (ref 26.0–34.0)
MCHC: 33.3 g/dL (ref 30.0–36.0)
MCV: 90.3 fL (ref 80.0–100.0)
Monocytes Absolute: 0.2 K/uL (ref 0.1–1.0)
Monocytes Relative: 2 %
Neutro Abs: 7.3 K/uL (ref 1.7–7.7)
Neutrophils Relative %: 92 %
Platelets: 225 K/uL (ref 150–400)
RBC: 4.52 MIL/uL (ref 4.22–5.81)
RDW: 13.4 % (ref 11.5–15.5)
WBC: 7.9 K/uL (ref 4.0–10.5)
nRBC: 0 % (ref 0.0–0.2)

## 2023-12-19 LAB — PHOSPHORUS: Phosphorus: 1.2 mg/dL — ABNORMAL LOW (ref 2.5–4.6)

## 2023-12-19 LAB — PROCALCITONIN: Procalcitonin: <0.1 ng/mL

## 2023-12-19 LAB — MAGNESIUM: Magnesium: 2.1 mg/dL (ref 1.7–2.4)

## 2023-12-19 LAB — PRO BRAIN NATRIURETIC PEPTIDE: Pro Brain Natriuretic Peptide: <50 pg/mL (ref <300.0)

## 2023-12-19 MED ORDER — IPRATROPIUM-ALBUTEROL 0.5-2.5 (3) MG/3ML IN SOLN
3.0000 mL | Freq: Two times a day (BID) | RESPIRATORY_TRACT | Status: DC
  Administered 2023-12-19 – 2023-12-21 (×4): 3 mL via RESPIRATORY_TRACT
  Filled 2023-12-19 (×4): qty 3

## 2023-12-19 MED ORDER — SENNA 8.6 MG PO TABS
1.0000 | ORAL_TABLET | Freq: Every day | ORAL | Status: DC
  Administered 2023-12-19 – 2023-12-20 (×2): 8.6 mg via ORAL
  Filled 2023-12-19 (×2): qty 1

## 2023-12-19 MED ORDER — POTASSIUM PHOSPHATES 15 MMOLE/5ML IV SOLN
30.0000 mmol | Freq: Once | INTRAVENOUS | Status: AC
  Administered 2023-12-19: 30 mmol via INTRAVENOUS
  Filled 2023-12-19: qty 10

## 2023-12-19 MED ORDER — POLYETHYLENE GLYCOL 3350 17 G PO PACK
17.0000 g | PACK | Freq: Every day | ORAL | Status: DC
  Administered 2023-12-19 – 2023-12-20 (×2): 17 g via ORAL
  Filled 2023-12-19 (×2): qty 1

## 2023-12-19 NOTE — Hospital Course (Signed)
 58 year old male PMH COPD presenting with shortness of breath.  Admitted for COPD exacerbation.  Resolved rapidly however hospitalization prolonged by marked hypophosphatemia.  Consultants None   Procedures/Events 12/7 admit for COPD exacerbation

## 2023-12-19 NOTE — Progress Notes (Signed)
  Progress Note   Patient: Stephen Miller FMW:[id number] DOB: June 18, 1965 DOA: 12/18/2023     1 DOS: the patient was seen and examined on 12/19/2023   Brief hospital course: 58 year old male PMH COPD presenting with shortness of breath.  Admitted for COPD exacerbation  Consultants None   Procedures/Events 12/7 admit for COPD exacerbation  Assessment and Plan: Acute COPD exacerbation Improved but not resolved.  Continue steroids, bronchodilators, antibiotic.  Hypophosphatemia Replete and recheck in a.m.  Essential hypertension Stable     Subjective:  Feels better but not back to baseline  Physical Exam: Vitals:   12/19/23 0627 12/19/23 0639 12/19/23 1047 12/19/23 1341  BP:  130/77 (!) 152/87 (!) 154/87  Pulse:  93 95 86  Resp:  20  17  Temp:  98 F (36.7 C) 98.2 F (36.8 C) (!) 97.5 F (36.4 C)  TempSrc:   Oral Oral  SpO2: 97% 97% 96% 97%  Weight:      Height:       Physical Exam Vitals reviewed.  Constitutional:      General: He is not in acute distress.    Appearance: He is not ill-appearing or toxic-appearing.  Cardiovascular:     Rate and Rhythm: Normal rate and regular rhythm.     Heart sounds: No murmur heard. Pulmonary:     Effort: Pulmonary effort is normal. No respiratory distress.     Breath sounds: No wheezing, rhonchi or rales.  Neurological:     Mental Status: He is alert.  Psychiatric:        Mood and Affect: Mood normal.        Behavior: Behavior normal.     Data Reviewed: Potassium 3.3 Phosphorus 1.2 Magnesium  within normal limits  Family Communication: None  Disposition: Status is: Observation      Time spent: 35 minutes  Author: Toribio Door, MD 12/19/2023 3:30 PM  For on call review [email or web address].

## 2023-12-20 LAB — BASIC METABOLIC PANEL WITH GFR
Anion gap: 11 (ref 5–15)
BUN: 20 mg/dL (ref 6–20)
CO2: 23 mmol/L (ref 22–32)
Calcium: 8.4 mg/dL — ABNORMAL LOW (ref 8.9–10.3)
Chloride: 101 mmol/L (ref 98–111)
Creatinine, Ser: 0.98 mg/dL (ref 0.61–1.24)
GFR, Estimated: >60 mL/min (ref >60)
Glucose, Bld: 176 mg/dL — ABNORMAL HIGH (ref 70–99)
Potassium: 4.2 mmol/L (ref 3.5–5.1)
Sodium: 135 mmol/L (ref 135–145)

## 2023-12-20 LAB — PHOSPHORUS: Phosphorus: <1 mg/dL — CL (ref 2.5–4.6)

## 2023-12-20 MED ORDER — PREDNISONE 20 MG PO TABS
40.0000 mg | ORAL_TABLET | Freq: Every day | ORAL | Status: DC
  Administered 2023-12-21: 40 mg via ORAL
  Filled 2023-12-20 (×2): qty 2

## 2023-12-20 MED ORDER — K PHOS MONO-SOD PHOS DI & MONO 155-852-130 MG PO TABS
250.0000 mg | ORAL_TABLET | Freq: Three times a day (TID) | ORAL | Status: AC
  Administered 2023-12-20 (×3): 250 mg via ORAL
  Filled 2023-12-20 (×3): qty 1

## 2023-12-20 MED ORDER — AZITHROMYCIN 250 MG PO TABS
500.0000 mg | ORAL_TABLET | Freq: Every day | ORAL | Status: DC
  Administered 2023-12-20 – 2023-12-21 (×2): 500 mg via ORAL
  Filled 2023-12-20 (×2): qty 2

## 2023-12-20 MED ORDER — SODIUM PHOSPHATES 45 MMOLE/15ML IV SOLN
45.0000 mmol | Freq: Once | INTRAVENOUS | Status: AC
  Administered 2023-12-20: 45 mmol via INTRAVENOUS
  Filled 2023-12-20: qty 15

## 2023-12-20 MED ORDER — ORAL CARE MOUTH RINSE: 15.0000 mL | OROMUCOSAL | Status: DC | PRN

## 2023-12-20 NOTE — Progress Notes (Addendum)
  Progress Note   Patient: Stephen Miller FMW:[id number] DOB: [birth year] DOA: 12/18/2023     1 DOS: the patient was seen and examined on 12/20/2023   Brief hospital course: 58 year old male PMH COPD presenting with shortness of breath.  Admitted for COPD exacerbation.  Resolved rapidly however hospitalization prolonged by marked hypophosphatemia.  Consultants None   Procedures/Events 12/7 admit for COPD exacerbation  Assessment and Plan: Acute COPD exacerbation Resolved.  Continue steroids, bronchodilators, antibiotic.   Hypophosphatemia Severe today despite repletion yesterday. More aggressive repletion today.   Essential hypertension Stable    Subjective:  Feels better, breathing better  Physical Exam: Vitals:   12/20/23 0500 12/20/23 0512 12/20/23 0916 12/20/23 1336  BP:  138/77  (!) 146/79  Pulse:  89  96  Resp:  18  18  Temp:  97.6 F (36.4 C)  98.7 F (37.1 C)  TempSrc:    Oral  SpO2:  97% 94% 97%  Weight: 109 kg     Height:       Physical Exam Vitals reviewed.  Constitutional:      General: He is not in acute distress.    Appearance: He is not ill-appearing or toxic-appearing.  Cardiovascular:     Rate and Rhythm: Normal rate and regular rhythm.     Heart sounds: No murmur heard. Pulmonary:     Effort: Pulmonary effort is normal. No respiratory distress.     Breath sounds: No wheezing, rhonchi or rales.  Neurological:     Mental Status: He is alert.  Psychiatric:        Mood and Affect: Mood normal.        Behavior: Behavior normal.     Data Reviewed: BMP unremarkable Phosphorus less than 1  Family Communication: wife by telephone  Disposition: Status is: Observation      Time spent: 35 minutes  Author: Toribio Door, MD 12/20/2023 4:58 PM  For on call review [email or web address].

## 2023-12-20 NOTE — Progress Notes (Signed)
 Mobility Specialist Progress Note:   12/20/23 1052  Mobility  Activity Ambulated with assistance  Level of Assistance Independent  Assistive Device  (IV Pole)  Distance Ambulated (ft) 500 ft  Activity Response Tolerated well  Mobility Referral Yes  Mobility visit 1 Mobility  Mobility Specialist Start Time (ACUTE ONLY) 1010  Mobility Specialist Stop Time (ACUTE ONLY) 1021  Mobility Specialist Time Calculation (min) (ACUTE ONLY) 11 min   Pt was received in bed and agreed to mobility. No complaints during ambulation. Returned to bed with all needs met. Call bell in reach.  [employer] - Mobility Specialist

## 2023-12-21 ENCOUNTER — Other Ambulatory Visit (HOSPITAL_COMMUNITY): Payer: Self-pay

## 2023-12-21 DIAGNOSIS — J441 Chronic obstructive pulmonary disease with (acute) exacerbation: Secondary | ICD-10-CM | POA: Diagnosis not present

## 2023-12-21 DIAGNOSIS — Z8679 Personal history of other diseases of the circulatory system: Secondary | ICD-10-CM | POA: Diagnosis not present

## 2023-12-21 DIAGNOSIS — R0602 Shortness of breath: Secondary | ICD-10-CM | POA: Diagnosis not present

## 2023-12-21 LAB — PHOSPHORUS: Phosphorus: 2.7 mg/dL (ref 2.5–4.6)

## 2023-12-21 MED ORDER — TRELEGY ELLIPTA 100-62.5-25 MCG/ACT IN AEPB
1.0000 | INHALATION_SPRAY | Freq: Every day | RESPIRATORY_TRACT | 0 refills | Status: AC
  Filled 2023-12-21: qty 60, 30d supply, fill #0

## 2023-12-21 MED ORDER — ALBUTEROL SULFATE HFA 108 (90 BASE) MCG/ACT IN AERS
2.0000 | INHALATION_SPRAY | Freq: Four times a day (QID) | RESPIRATORY_TRACT | 2 refills | Status: AC | PRN
  Filled 2023-12-21: qty 6.7, 17d supply, fill #0

## 2023-12-21 MED ORDER — SENNA 8.6 MG PO TABS
2.0000 | ORAL_TABLET | Freq: Every day | ORAL | Status: DC
  Administered 2023-12-21: 17.2 mg via ORAL
  Filled 2023-12-21: qty 2

## 2023-12-21 MED ORDER — POLYETHYLENE GLYCOL 3350 17 G PO PACK
34.0000 g | PACK | Freq: Every day | ORAL | Status: DC
  Administered 2023-12-21: 34 g via ORAL
  Filled 2023-12-21: qty 2

## 2023-12-21 MED ORDER — SENNOSIDES-DOCUSATE SODIUM 8.6-50 MG PO TABS: 1.0000 | ORAL_TABLET | Freq: Two times a day (BID) | ORAL | Status: AC | PRN

## 2023-12-21 MED ORDER — POLYETHYLENE GLYCOL 3350 17 GM/SCOOP PO POWD
17.0000 g | Freq: Two times a day (BID) | ORAL | 0 refills | Status: AC | PRN
  Filled 2023-12-21: qty 238, 7d supply, fill #0

## 2023-12-21 MED ORDER — PREDNISONE 20 MG PO TABS
40.0000 mg | ORAL_TABLET | Freq: Every day | ORAL | 0 refills | Status: AC
  Filled 2023-12-21: qty 4, 2d supply, fill #0

## 2023-12-21 NOTE — Discharge Summary (Signed)
 Physician Discharge Summary  Stephen Miller FMW:[id number] DOB: Feb 09, 1965 DOA: 12/18/2023  PCP: Celestia Harder, NP  Admit date: 12/18/2023 Discharge date: 12/21/23  Admitted From: Home. Disposition: Home. Recommendations for Outpatient Follow-up:  Outpatient follow-up with PCP in 1 to 2 weeks Check CMP and CBC at follow-up Please follow up on the following pending results: None  Home Health: No need identified Equipment/Devices: No need identified  Discharge Condition: Stable CODE STATUS: Full code   Follow-up Information     Celestia Harder, NP. Schedule an appointment as soon as possible for a visit in 1 week(s).   Specialty: Nurse Practitioner Contact information: [address] [place] [address] [phone]                 Hospital course 58 year old M with PMH of COPD, HTN and prior tobacco use presenting with shortness of breath and admitted with COPD exacerbation.  COVID-19, influenza and RSV PCR nonreactive.  Portable CXR without acute finding.  Patient was started on steroid and nebulizers, and admitted.  COPD exacerbation improved quickly.  He was discharged on p.o. prednisone  for 3 more days to complete a full 5 days course.  Renewed his prescription for Trelegy and rescue inhalers.  See individual problem list below for more.   Problems addressed during this hospitalization Acute COPD exacerbation: Exacerbation resolved.  On room air.  No respiratory distress. -Continue p.o. prednisone  40 mg daily for 3 more days -Continue home Trelegy Ellipta  and rescue inhaler.  Hypophosphatemia: Resolved.   Essential hypertension: Normotensive. -Continue home HCTZ  Constipation - MiraLAX  and Senokot-S  Class II obesity Body mass index is 35.05 kg/m.           Consultations: None  Time spent 35  minutes  Vital signs Vitals:   12/20/23 2005 12/20/23 2006 12/21/23 0433 12/21/23 0830  BP: (!) 143/92  138/86   Pulse: 90  84    Temp: 98.6 F (37 C)  97.8 F (36.6 C)   Resp: 16  20   Height:      Weight:   114 kg   SpO2: 94% 94% 97% 95%  TempSrc: Oral  Oral   BMI (Calculated):   35.07      Discharge exam  GENERAL: No apparent distress.  Nontoxic. HEENT: MMM.  Vision and hearing grossly intact.  NECK: Supple.  No apparent JVD.  RESP:  No IWOB.  Fair aeration bilaterally. CVS:  RRR. Heart sounds normal.  ABD/GI/GU: BS+. Abd soft, NTND.  MSK/EXT:  Moves extremities. No apparent deformity. No edema.  SKIN: no apparent skin lesion or wound NEURO: Awake and alert. Oriented appropriately.  No apparent focal neuro deficit. PSYCH: Calm. Normal affect.   Discharge Instructions Discharge Instructions     Discharge instructions   Complete by: As directed    It has been a pleasure taking care of you!  You were hospitalized due to COPD exacerbation.  Your symptoms improved with treatment.  We are discharging you on more steroid to complete treatment course.  Continue using your Trelegy daily and albuterol  as needed.  Follow-up with your primary care doctor in 1 to 2 weeks or sooner if needed.   Take care,   Increase activity slowly   Complete by: As directed       Allergies as of 12/21/2023       Reactions   Iodinated Contrast Media Anaphylaxis, Nausea And Vomiting, Shortness Of Breath, Swelling   Throat became swollen Throat became swollen  Vomiting, throat swelling   Ketorolac  Tromethamine  Anaphylaxis, Other (See Comments), Swelling   Lips became swollen Lips became swollen    Lip swelling    Lip swelling Lip swelling   Other Anaphylaxis, Other (See Comments), Swelling   Per the patient: Dye used during a MRI, and not iodine   Tramadol Other (See Comments), Shortness Of Breath, Swelling   Face and neck became swollen        Medication List     STOP taking these medications    Airsupra  90-80 MCG/ACT Aero Generic drug: Albuterol -Budesonide    Bayer Low Dose 81 MG tablet Generic  drug: aspirin EC   ipratropium 0.02 % nebulizer solution Commonly known as: ATROVENT    ipratropium-albuterol  0.5-2.5 (3) MG/3ML Soln Commonly known as: DUONEB   omeprazole  40 MG capsule Commonly known as: PRILOSEC   sucralfate  1 g tablet Commonly known as: Carafate        TAKE these medications    albuterol  108 (90 Base) MCG/ACT inhaler Commonly known as: VENTOLIN  HFA Inhale 2 puffs into the lungs every 6 (six) hours as needed for wheezing or shortness of breath.   cetirizine  10 MG tablet Commonly known as: ZYRTEC  Take 1 tablet (10 mg total) by mouth daily.   fluticasone  50 MCG/ACT nasal spray Commonly known as: FLONASE  Place 1 spray into both nostrils in the morning and at bedtime. What changed:  when to take this reasons to take this   hydrochlorothiazide  25 MG tablet Commonly known as: HYDRODIURIL  Take 25 mg by mouth daily.   montelukast  10 MG tablet Commonly known as: SINGULAIR  Take 1 tablet (10 mg total) by mouth at bedtime.   polyethylene glycol powder 17 GM/SCOOP powder Commonly known as: MiraLax  Take 17 g by mouth 2 (two) times daily as needed for moderate constipation.   predniSONE  20 MG tablet Commonly known as: DELTASONE  Take 2 tablets (40 mg total) by mouth daily with breakfast for 2 days. Start taking on: December 22, 2023 What changed:  medication strength how much to take how to take this when to take this additional instructions   senna-docusate 8.6-50 MG tablet Commonly known as: Senokot-S Take 1-2 tablets by mouth 2 (two) times daily between meals as needed for mild constipation or moderate constipation.   Trelegy Ellipta  100-62.5-25 MCG/ACT Aepb Generic drug: Fluticasone -Umeclidin-Vilant Inhale 1 puff into the lungs daily.         Procedures/Studies:   DG Chest Port 1 View Result Date: 12/18/2023 EXAM: 1 VIEW(S) XRAY OF THE CHEST 12/18/2023 04:02:42 PM COMPARISON: 19 days ago. CLINICAL HISTORY: SOB (shortness of breath).  FINDINGS: LUNGS AND PLEURA: No focal pulmonary opacity. No pleural effusion. No pneumothorax. HEART AND MEDIASTINUM: No acute abnormality of the cardiac and mediastinal silhouettes. BONES AND SOFT TISSUES: No acute osseous abnormality. IMPRESSION: 1. No acute cardiopulmonary process. Electronically signed by: Lynwood Seip MD 12/18/2023 04:20 PM EST RP Workstation: [id number]   DG Chest 2 View Result Date: 11/29/2023 EXAM: 2 VIEW(S) XRAY OF THE CHEST 11/29/2023 11:55:10 AM COMPARISON: 03/29/2023 CLINICAL HISTORY: cough FINDINGS: LUNGS AND PLEURA: No focal pulmonary opacity. No pleural effusion. No pneumothorax. HEART AND MEDIASTINUM: No acute abnormality of the cardiac and mediastinal silhouettes. BONES AND SOFT TISSUES: No acute osseous abnormality. IMPRESSION: 1. No acute cardiopulmonary disease. Electronically signed by: Lynwood Seip MD 11/29/2023 12:17 PM EST RP Workstation: [id number]       The results of significant diagnostics from this hospitalization (including imaging, microbiology, ancillary and laboratory) are listed below for reference.  Microbiology: Recent Results (from the past 240 hours)  Resp panel by RT-PCR (RSV, Flu A&B, Covid) Anterior Nasal Swab     Status: None   Collection Time: 12/18/23  7:37 PM   Specimen: Anterior Nasal Swab  Result Value Ref Range Status   SARS Coronavirus 2 by RT PCR NEGATIVE NEGATIVE Final    Comment: (NOTE) SARS-CoV-2 target nucleic acids are NOT DETECTED.  The SARS-CoV-2 RNA is generally detectable in upper respiratory specimens during the acute phase of infection. The lowest concentration of SARS-CoV-2 viral copies this assay can detect is 138 copies/mL. A negative result does not preclude SARS-Cov-2 infection and should not be used as the sole basis for treatment or other patient management decisions. A negative result may occur with  improper specimen collection/handling, submission of specimen other than nasopharyngeal swab, presence  of viral mutation(s) within the areas targeted by this assay, and inadequate number of viral copies(<138 copies/mL). A negative result must be combined with clinical observations, patient history, and epidemiological information. The expected result is Negative.  Fact Sheet for Patients:  bloggercourse.com  Fact Sheet for Healthcare Providers:  seriousbroker.it  This test is no t yet approved or cleared by the [place]  FDA and  has been authorized for detection and/or diagnosis of SARS-CoV-2 by FDA under an Emergency Use Authorization (EUA). This EUA will remain  in effect (meaning this test can be used) for the duration of the COVID-19 declaration under Section 564(b)(1) of the Act, 21 U.S.C.section 360bbb-3(b)(1), unless the authorization is terminated  or revoked sooner.       Influenza A by PCR NEGATIVE NEGATIVE Final   Influenza B by PCR NEGATIVE NEGATIVE Final    Comment: (NOTE) The Xpert Xpress SARS-CoV-2/FLU/RSV plus assay is intended as an aid in the diagnosis of influenza from Nasopharyngeal swab specimens and should not be used as a sole basis for treatment. Nasal washings and aspirates are unacceptable for Xpert Xpress SARS-CoV-2/FLU/RSV testing.  Fact Sheet for Patients: bloggercourse.com  Fact Sheet for Healthcare Providers: seriousbroker.it  This test is not yet approved or cleared by the [place]  FDA and has been authorized for detection and/or diagnosis of SARS-CoV-2 by FDA under an Emergency Use Authorization (EUA). This EUA will remain in effect (meaning this test can be used) for the duration of the COVID-19 declaration under Section 564(b)(1) of the Act, 21 U.S.C. section 360bbb-3(b)(1), unless the authorization is terminated or revoked.     Resp Syncytial Virus by PCR NEGATIVE NEGATIVE Final    Comment: (NOTE) Fact Sheet for  Patients: bloggercourse.com  Fact Sheet for Healthcare Providers: seriousbroker.it  This test is not yet approved or cleared by the [place]  FDA and has been authorized for detection and/or diagnosis of SARS-CoV-2 by FDA under an Emergency Use Authorization (EUA). This EUA will remain in effect (meaning this test can be used) for the duration of the COVID-19 declaration under Section 564(b)(1) of the Act, 21 U.S.C. section 360bbb-3(b)(1), unless the authorization is terminated or revoked.  Performed at [facility], [address] [place], [place] [address]      Labs:  CBC: Recent Labs  Lab 12/18/23 1551 12/19/23 0349  WBC 6.4 7.9  NEUTROABS  --  7.3  HGB 14.0 13.6  HCT 40.9 40.8  MCV 87.0 90.3  PLT 224 225   BMP &GFR Recent Labs  Lab 12/18/23 1551 12/19/23 0349 12/20/23 0356 12/21/23 0414  NA 141 137 135  --   K 3.9 3.3* 4.2  --  CL 102 99 101  --   CO2 29 22 23   --   GLUCOSE 105* 180* 176*  --   BUN 13 17 20   --   CREATININE 1.31* 1.21 0.98  --   CALCIUM 9.2 9.2 8.4*  --   MG  --  2.1  --   --   PHOS  --  1.2* <1.0* 2.7   Estimated Creatinine Clearance: 108.1 mL/min (by C-G formula based on SCr of 0.98 mg/dL). Liver & Pancreas: Recent Labs  Lab 12/19/23 0349  AST 30  ALT 33  ALKPHOS 71  BILITOT 0.4  PROT 7.1  ALBUMIN 4.3   No results for input(s): LIPASE, AMYLASE in the last 168 hours. No results for input(s): AMMONIA in the last 168 hours. Diabetic: No results for input(s): HGBA1C in the last 72 hours. No results for input(s): GLUCAP in the last 168 hours. Cardiac Enzymes: No results for input(s): CKTOTAL, CKMB, CKMBINDEX, TROPONINI in the last 168 hours. Recent Labs    12/19/23 0349  PROBNP <50.0   Coagulation Profile: No results for input(s): INR, PROTIME in the last 168 hours. Thyroid Function Tests: No results for input(s): TSH,  T4TOTAL, FREET4, T3FREE, THYROIDAB in the last 72 hours. Lipid Profile: No results for input(s): CHOL, HDL, LDLCALC, TRIG, CHOLHDL, LDLDIRECT in the last 72 hours. Anemia Panel: No results for input(s): VITAMINB12, FOLATE, FERRITIN, TIBC, IRON, RETICCTPCT in the last 72 hours. Urine analysis:    Component Value Date/Time   COLORURINE YELLOW 08/22/2023 0718   APPEARANCEUR CLEAR 08/22/2023 0718   LABSPEC 1.021 08/22/2023 0718   PHURINE 6.0 08/22/2023 0718   GLUCOSEU NEGATIVE 08/22/2023 0718   HGBUR NEGATIVE 08/22/2023 0718   BILIRUBINUR NEGATIVE 08/22/2023 0718   KETONESUR NEGATIVE 08/22/2023 0718   PROTEINUR NEGATIVE 08/22/2023 0718   NITRITE NEGATIVE 08/22/2023 0718   LEUKOCYTESUR SMALL (A) 08/22/2023 0718   Sepsis Labs: Invalid input(s): PROCALCITONIN, LACTICIDVEN   SIGNED:  Kastin Cerda T Laylah Riga, MD  [facility] 12/21/2023, 4:04 PM

## 2023-12-21 NOTE — Progress Notes (Signed)
 Discharge meds in a secure bag delivered to patient in room by this RN. Trelegy not filled due to cost. Patient states he has duonebs at home and will follow up with his PCP this week and call his pulmonary doctor to see if they have samples or an alternative option

## 2023-12-21 NOTE — Plan of Care (Signed)
°  Problem: Education: Goal: Knowledge of General Education information will improve Description: Including pain rating scale, medication(s)/side effects and non-pharmacologic comfort measures Outcome: Progressing   Problem: Clinical Measurements: Goal: Diagnostic test results will improve Outcome: Progressing Goal: Respiratory complications will improve Outcome: Progressing Goal: Cardiovascular complication will be avoided Outcome: Progressing   Problem: Clinical Measurements: Goal: Respiratory complications will improve Outcome: Progressing   Problem: Clinical Measurements: Goal: Cardiovascular complication will be avoided Outcome: Progressing

## 2023-12-21 NOTE — Progress Notes (Signed)
°   12/21/23 0900  TOC Brief Assessment  Insurance and Status Reviewed  Patient has primary care physician Yes  Home environment has been reviewed home with self  Prior level of function: independent  Prior/Current Home Services No current home services  Social Drivers of Health Review SDOH reviewed no interventions necessary  Readmission risk has been reviewed Yes  Transition of care needs no transition of care needs at this time

## 2024-01-01 LAB — BLOOD GAS, VENOUS
Acid-Base Excess: 0.7 mmol/L (ref 0.0–2.0)
Bicarbonate: 24.3 mmol/L (ref 20.0–28.0)
Drawn by: 71375
O2 Saturation: 86.7 %
Patient temperature: 36.8
pCO2, Ven: 35 mmHg — ABNORMAL LOW (ref 44–60)
pH, Ven: 7.45 — ABNORMAL HIGH (ref 7.25–7.43)
pO2, Ven: 50 mmHg — ABNORMAL HIGH (ref 32–45)
